# Patient Record
Sex: Female | Born: 1995 | Race: White | Hispanic: No | Marital: Married | Smoking: Never smoker
Health system: Southern US, Community
[De-identification: ages and names within clinical notes are randomized; demographics above are authoritative.]

## PROBLEM LIST (undated history)

## (undated) ENCOUNTER — Inpatient Hospital Stay (HOSPITAL_COMMUNITY): Payer: Self-pay

## (undated) DIAGNOSIS — K831 Obstruction of bile duct: Secondary | ICD-10-CM

## (undated) DIAGNOSIS — Z789 Other specified health status: Secondary | ICD-10-CM

## (undated) DIAGNOSIS — O26649 Intrahepatic cholestasis of pregnancy, unspecified trimester: Secondary | ICD-10-CM

## (undated) DIAGNOSIS — O26619 Liver and biliary tract disorders in pregnancy, unspecified trimester: Secondary | ICD-10-CM

## (undated) DIAGNOSIS — O24419 Gestational diabetes mellitus in pregnancy, unspecified control: Secondary | ICD-10-CM

## (undated) DIAGNOSIS — M419 Scoliosis, unspecified: Secondary | ICD-10-CM

## (undated) DIAGNOSIS — K589 Irritable bowel syndrome without diarrhea: Secondary | ICD-10-CM

## (undated) HISTORY — DX: Other specified health status: Z78.9

## (undated) HISTORY — DX: Gestational diabetes mellitus in pregnancy, unspecified control: O24.419

## (undated) HISTORY — PX: TONSILLECTOMY: SUR1361

## (undated) HISTORY — DX: Intrahepatic cholestasis of pregnancy, unspecified trimester: O26.649

## (undated) HISTORY — DX: Irritable bowel syndrome, unspecified: K58.9

## (undated) HISTORY — PX: WISDOM TOOTH EXTRACTION: SHX21

## (undated) HISTORY — DX: Obstruction of bile duct: K83.1

## (undated) HISTORY — DX: Liver and biliary tract disorders in pregnancy, unspecified trimester: O26.619

---

## 2015-05-11 NOTE — L&D Delivery Note (Signed)
  Roxanne MinsHelms, Boy Burdette [[id number]]  Delivery Note At 3:49 PM a viable female was delivered via  (Presentation: ROA, compound).  APGAR: 8, 9; weight pending . Placenta status: delivered intact with gentle traction.  Cord: 3 vessel with the following complications: nuchal x1 .  Cord pH: not colected  Mom to postpartum.  Baby to Couplet care / Skin to Skin.  Ernestina Pennaicholas Dez Stauffer 02/18/2016, 4:56 PM     Karle BarrHelms, BoyB Izetta [[id number]]  Delivery Note At 3:55 PM a viable female was delivered via  (Presentation: footling breech ).  APGAR: 1, 9; weight pending .   Placenta status: delivered intact .  Cord: 3 vessels with the following complications: none.  Cord pH: none  Mom to postpartum.  Baby to Couplet care / Skin to Skin.   Anesthesia:  epidural Episiotomy: None Lacerations: Labial Suture Repair: 3.0 vicryl Est. Blood Loss (mL):  200   Ernestina Pennaicholas Fatimah Sundquist 02/18/2016, 4:56 PM

## 2015-07-09 LAB — CYTOLOGY - PAP: PAP SMEAR: NEGATIVE

## 2015-12-19 LAB — GLUCOSE TOLERANCE, 1 HOUR: Glucose 1 Hour: 161

## 2015-12-22 LAB — OB RESULTS CONSOLE ABO/RH: RH TYPE: POSITIVE

## 2015-12-22 LAB — OB RESULTS CONSOLE RPR: RPR: NONREACTIVE

## 2015-12-22 LAB — OB RESULTS CONSOLE RUBELLA ANTIBODY, IGM: Rubella: NON-IMMUNE/NOT IMMUNE

## 2015-12-22 LAB — OB RESULTS CONSOLE HEPATITIS B SURFACE ANTIGEN: Hepatitis B Surface Ag: NEGATIVE

## 2015-12-22 LAB — OB RESULTS CONSOLE GC/CHLAMYDIA
Chlamydia: NEGATIVE
GC PROBE AMP, GENITAL: NEGATIVE

## 2015-12-22 LAB — OB RESULTS CONSOLE ANTIBODY SCREEN: ANTIBODY SCREEN: NEGATIVE

## 2015-12-22 LAB — OB RESULTS CONSOLE HIV ANTIBODY (ROUTINE TESTING): HIV: NONREACTIVE

## 2015-12-30 LAB — OB RESULTS CONSOLE PLATELET COUNT: Platelets: 243 10*3/uL

## 2015-12-30 LAB — OB RESULTS CONSOLE HGB/HCT, BLOOD
HCT: 30 %
HEMOGLOBIN: 9.7 g/dL

## 2016-01-05 ENCOUNTER — Ambulatory Visit: Payer: Medicaid Other | Admitting: *Deleted

## 2016-01-05 ENCOUNTER — Encounter: Payer: Medicaid Other | Attending: Obstetrics and Gynecology | Admitting: Dietician

## 2016-01-05 DIAGNOSIS — O24419 Gestational diabetes mellitus in pregnancy, unspecified control: Secondary | ICD-10-CM | POA: Insufficient documentation

## 2016-01-05 DIAGNOSIS — Z713 Dietary counseling and surveillance: Secondary | ICD-10-CM | POA: Insufficient documentation

## 2016-01-05 NOTE — Progress Notes (Signed)
Diabetes Education: 01/05/16 Katherine Munoz is a 20 yr old female patient coming to our clinic from [place].  She is a G0P1  EDD: 03/09/2016.  Pregnant with twins.  Has a meter and has been checking fasting and 1 hr pp glucose levels.  Was told her 1 hr pp goal was 120 mg/dl.  Notes she can only get that level reading if she walks between the time she eats and the time she test.  Brief review of the post delivery self-care measures to assist in preventing developing type 2 DM in the future. Verbalizes a desire to breast feed the twins.  Notes she has some family support with breast feeding. Review of the factors affecting blood glucose. Recommended waling 30 minutes or more daily. Review of blood glucose monitoring.  Ask she aim for our clinic goals of Fasting: 60-90 mg/dl and 2 hr pp (after first bite): 120 mg/dl or less. Currently fasting levels are reported 70's to 84, 86 mg/dl. While the 1 hr levels have been 125, 150, 115. Depended on the amount of walking she did. Brief review of carb counting and diet questions. Provided Handout "NUtrition, Diabetes, and Pregnancy along with the yellow carb counting card. Will follow as needed. Maggie Henery Betzold, RN, RD, LDN

## 2016-01-06 DIAGNOSIS — O0990 Supervision of high risk pregnancy, unspecified, unspecified trimester: Secondary | ICD-10-CM

## 2016-01-08 DIAGNOSIS — O0990 Supervision of high risk pregnancy, unspecified, unspecified trimester: Secondary | ICD-10-CM | POA: Insufficient documentation

## 2016-01-19 ENCOUNTER — Encounter: Payer: Self-pay | Admitting: Obstetrics and Gynecology

## 2016-01-19 ENCOUNTER — Ambulatory Visit (INDEPENDENT_AMBULATORY_CARE_PROVIDER_SITE_OTHER): Payer: Medicaid Other | Admitting: Obstetrics and Gynecology

## 2016-01-19 ENCOUNTER — Encounter: Payer: Medicaid Other | Attending: Obstetrics and Gynecology | Admitting: Dietician

## 2016-01-19 VITALS — BP 113/75 | HR 106 | Wt 152.9 lb

## 2016-01-19 DIAGNOSIS — O24419 Gestational diabetes mellitus in pregnancy, unspecified control: Secondary | ICD-10-CM | POA: Insufficient documentation

## 2016-01-19 DIAGNOSIS — O2343 Unspecified infection of urinary tract in pregnancy, third trimester: Secondary | ICD-10-CM

## 2016-01-19 DIAGNOSIS — B951 Streptococcus, group B, as the cause of diseases classified elsewhere: Secondary | ICD-10-CM | POA: Insufficient documentation

## 2016-01-19 DIAGNOSIS — Z713 Dietary counseling and surveillance: Secondary | ICD-10-CM | POA: Insufficient documentation

## 2016-01-19 DIAGNOSIS — O0993 Supervision of high risk pregnancy, unspecified, third trimester: Secondary | ICD-10-CM

## 2016-01-19 DIAGNOSIS — O30049 Twin pregnancy, dichorionic/diamniotic, unspecified trimester: Secondary | ICD-10-CM | POA: Insufficient documentation

## 2016-01-19 DIAGNOSIS — Z23 Encounter for immunization: Secondary | ICD-10-CM

## 2016-01-19 DIAGNOSIS — O2340 Unspecified infection of urinary tract in pregnancy, unspecified trimester: Secondary | ICD-10-CM

## 2016-01-19 LAB — POCT URINALYSIS DIP (DEVICE)
BILIRUBIN URINE: NEGATIVE
GLUCOSE, UA: NEGATIVE mg/dL
HGB URINE DIPSTICK: NEGATIVE
Ketones, ur: NEGATIVE mg/dL
NITRITE: NEGATIVE
Protein, ur: NEGATIVE mg/dL
Specific Gravity, Urine: 1.02 (ref 1.005–1.030)
UROBILINOGEN UA: 0.2 mg/dL (ref 0.0–1.0)
pH: 6.5 (ref 5.0–8.0)

## 2016-01-19 MED ORDER — TETANUS-DIPHTH-ACELL PERTUSSIS 5-2.5-18.5 LF-MCG/0.5 IM SUSP
0.5000 mL | Freq: Once | INTRAMUSCULAR | Status: AC
  Administered 2016-01-19: 0.5 mL via INTRAMUSCULAR

## 2016-01-19 NOTE — Progress Notes (Signed)
New OB Note  01/19/2016   Clinic: [facility]  Chief Complaint: Transfer of care  Transfer of Care Patient: Yes, [name]  History of Present Illness: Ms. Katherine Munoz is a 20 y.o. G2P0010 @ 32/6 weeks (EDC 10/31, based on 9wk u/s); LMP 05/28/2015 Preg complicated by has Supervision of high-risk pregnancy; GDM (gestational diabetes mellitus); Dichorionic diamniotic twin gestation; and GBS (group B streptococcus) UTI complicating pregnancy on her problem list. just feeling some pressure but no s/s of labor or decreased FM.   She was using oral contraceptives (estrogen/progesterone) when she conceived.  She has Negative signs or symptoms of miscarriage or preterm labor  ROS: A 12-point review of systems was performed and negative, except as stated in the above HPI.  OBGYN History: As per HPI. OB History  Gravida Para Term Preterm AB Living  2 0 0 0 1 0  SAB TAB Ectopic Multiple Live Births  1            # Outcome Date GA Lbr Len/2nd Weight Sex Delivery Anes PTL Lv  2 Current           1 SAB 2016              Any issues with any prior pregnancies: not applicable Any prior children are healthy, doing well, without any problems or issues: not applicable History of pap smears: Yes. Last pap smear 2017. (negative) History of STIs: No   Past Medical History: Past Medical History:  Diagnosis Date  . Medical history non-contributory     Past Surgical History: Past Surgical History:  Procedure Laterality Date  . TONSILLECTOMY      Family History:  Family History  Problem Relation Age of Onset  . Heart disease Maternal Grandfather    She denies any female cancers, bleeding or blood clotting disorders.  She denies any history of mental retardation, birth defects or genetic disorders in her or the FOB's history  Social History:  Social History   Social History  . Marital status: Single    Spouse name: N/A  . Number of children: N/A  . Years of education: N/A   Occupational  History  . Not on file.   Social History Main Topics  . Smoking status: Never Smoker  . Smokeless tobacco: Never Used  . Alcohol use No  . Drug use: No  . Sexual activity: Not Currently    Birth control/ protection: Pill   Other Topics Concern  . Not on file   Social History Narrative  . No narrative on file   Not currently working In a relationship with FOB  Allergy: No Known Allergies  Health Maintenance:  Mammogram Up to Date: not applicable  Current Outpatient Medications: PNV  Physical Exam:   BP 113/75   Pulse (!) 106   Wt 152 lb 14.4 oz (69.4 kg)   LMP 05/28/2015   BMI 28.89 kg/m  Body mass index is 28.89 kg/m. FHTs: normal x 2  General appearance: Well nourished, well developed female in no acute distress.   Laboratory: O pos/RI/syphilis screen negative/hiv neg/hepB neg/pap neg 2017/GC-CT neg UCx +GBS early in pregnancy  BS log review: normal qid checks with borderline BS 2hr PP dinner values (approx 25% in the 120s-130s)  Imaging:  Normal anatomy scan  Assessment: pt doing well  Plan: 1. Needs flu shot - Flu Vaccine QUAD 36+ mos IM (Fluarix, Quad PF)  2. Need for Tdap vaccination - Tdap (BOOSTRIX) injection 0.5 mL; Inject 0.5 mLs into  the muscle once.  3. Supervision of high-risk pregnancy, third trimester Scheduled for growth u/s next week.  - US MFM OB COMP + 14 WK; Future - US MFM OB COMP ADDL GEST + 14 WK; Future  4. GBS (group B streptococcus) UTI complicating pregnancy, third trimester TOC today  5. GDMa1 Continue with a1 control. Will have her see Seward GraterMaggie in DM education today and f/u on 2hr dinner values next week  6. Di-Di D/w pt re: plan of care delivery mode. She is interested in vaginal delivery and would be candidate if vtx fetal A and size concordant. Delivery by 38wks Pt told to add 1mg  FA  Problem list reviewed and updated.  Follow up in 1 weeks.  >50% of 25 min visit spent on counseling and coordination of care.      Cornelia Copaharlie Leo Fray, Jr. MD Attending Center for [facility](Faculty Practice)

## 2016-01-19 NOTE — Progress Notes (Signed)
Diabetes Education: 01/19/16 Luther ParodyCaitlin is seen today for f/u regarding blood glucose levels.  Currently at 5060w6d.  Previously received review of GDM self-care management with twins. Fasting levels: 80, 82, 83, 75, 69 , 77, 88. 2 hr post BK: 114, 107, 75, 122, 96, 94. 2 hr post Lunch: 117, 96, 107, 107, 103, 101. 2 hr post Dinner: 130, 123, 124, 107, 130, 136. Notes that the evening meal has been the greatest problem.  Her significant other and is family are [place] and have a diet high in rice and plantains.  She is working to monitor portion sizes. Has found walking to be more difficult.  Becomes short of breath after walking greater distances.  Advised to try  for 10 minutes in the afternoon before dinner and if possible try to walk for 5-10 minutes after dinner when food has had time to settle. C/o hunger in the afternoon.  Advised to increase carb to 45-60 gm at lunch and at the afternoon snack. Will continue to follow as needed. Maggie May, RN, RD, LDN

## 2016-01-19 NOTE — Progress Notes (Signed)
Flu/tdap today

## 2016-01-21 ENCOUNTER — Encounter (HOSPITAL_COMMUNITY): Payer: Self-pay | Admitting: Obstetrics and Gynecology

## 2016-01-28 ENCOUNTER — Other Ambulatory Visit: Payer: Self-pay | Admitting: Obstetrics and Gynecology

## 2016-01-28 ENCOUNTER — Ambulatory Visit (HOSPITAL_COMMUNITY)
Admission: RE | Admit: 2016-01-28 | Discharge: 2016-01-28 | Disposition: A | Discharge status: Home / Self Care | Payer: Medicaid Other | Source: Ambulatory Visit | Attending: Obstetrics and Gynecology | Admitting: Obstetrics and Gynecology

## 2016-01-28 ENCOUNTER — Ambulatory Visit (INDEPENDENT_AMBULATORY_CARE_PROVIDER_SITE_OTHER): Payer: Medicaid Other | Admitting: Family Medicine

## 2016-01-28 ENCOUNTER — Encounter (HOSPITAL_COMMUNITY): Payer: Self-pay

## 2016-01-28 VITALS — BP 107/76 | HR 107 | Wt 156.2 lb

## 2016-01-28 DIAGNOSIS — Z3A34 34 weeks gestation of pregnancy: Secondary | ICD-10-CM | POA: Insufficient documentation

## 2016-01-28 DIAGNOSIS — O0990 Supervision of high risk pregnancy, unspecified, unspecified trimester: Secondary | ICD-10-CM

## 2016-01-28 DIAGNOSIS — O24410 Gestational diabetes mellitus in pregnancy, diet controlled: Secondary | ICD-10-CM

## 2016-01-28 DIAGNOSIS — O0993 Supervision of high risk pregnancy, unspecified, third trimester: Secondary | ICD-10-CM

## 2016-01-28 DIAGNOSIS — O30043 Twin pregnancy, dichorionic/diamniotic, third trimester: Secondary | ICD-10-CM | POA: Diagnosis not present

## 2016-01-28 LAB — POCT URINALYSIS DIP (DEVICE)
Bilirubin Urine: NEGATIVE
Glucose, UA: NEGATIVE mg/dL
Ketones, ur: NEGATIVE mg/dL
NITRITE: NEGATIVE
PH: 6.5 (ref 5.0–8.0)
PROTEIN: NEGATIVE mg/dL
SPECIFIC GRAVITY, URINE: 1.02 (ref 1.005–1.030)
UROBILINOGEN UA: 0.2 mg/dL (ref 0.0–1.0)

## 2016-01-28 NOTE — Patient Instructions (Signed)
Third Trimester of Pregnancy The third trimester is from week 29 through week 42, months 7 through 9. The third trimester is a time when the fetus is growing rapidly. At the end of the ninth month, the fetus is about 20 inches in length and weighs 6-10 pounds.  BODY CHANGES Your body goes through many changes during pregnancy. The changes vary from woman to woman.   Your weight will continue to increase. You can expect to gain 25-35 pounds (11-16 kg) by the end of the pregnancy.  You may begin to get stretch marks on your hips, abdomen, and breasts.  You may urinate more often because the fetus is moving lower into your pelvis and pressing on your bladder.  You may develop or continue to have heartburn as a result of your pregnancy.  You may develop constipation because certain hormones are causing the muscles that push waste through your intestines to slow down.  You may develop hemorrhoids or swollen, bulging veins (varicose veins).  You may have pelvic pain because of the weight gain and pregnancy hormones relaxing your joints between the bones in your pelvis. Backaches may result from overexertion of the muscles supporting your posture.  You may have changes in your hair. These can include thickening of your hair, rapid growth, and changes in texture. Some women also have hair loss during or after pregnancy, or hair that feels dry or thin. Your hair will most likely return to normal after your baby is born.  Your breasts will continue to grow and be tender. A yellow discharge may leak from your breasts called colostrum.  Your belly button may stick out.  You may feel short of breath because of your expanding uterus.  You may notice the fetus "dropping," or moving lower in your abdomen.  You may have a bloody mucus discharge. This usually occurs a few days to a week before labor begins.  Your cervix becomes thin and soft (effaced) near your due date. WHAT TO EXPECT AT YOUR  PRENATAL EXAMS  You will have prenatal exams every 2 weeks until week 36. Then, you will have weekly prenatal exams. During a routine prenatal visit:  You will be weighed to make sure you and the fetus are growing normally.  Your blood pressure is taken.  Your abdomen will be measured to track your baby's growth.  The fetal heartbeat will be listened to.  Any test results from the previous visit will be discussed.  You may have a cervical check near your due date to see if you have effaced. At around 36 weeks, your caregiver will check your cervix. At the same time, your caregiver will also perform a test on the secretions of the vaginal tissue. This test is to determine if a type of bacteria, Group B streptococcus, is present. Your caregiver will explain this further. Your caregiver may ask you:  What your birth plan is.  How you are feeling.  If you are feeling the baby move.  If you have had any abnormal symptoms, such as leaking fluid, bleeding, severe headaches, or abdominal cramping.  If you are using any tobacco products, including cigarettes, chewing tobacco, and electronic cigarettes.  If you have any questions. Other tests or screenings that may be performed during your third trimester include:  Blood tests that check for low iron levels (anemia).  Fetal testing to check the health, activity level, and growth of the fetus. Testing is done if you have certain medical conditions or if   there are problems during the pregnancy.  HIV (human immunodeficiency virus) testing. If you are at high risk, you may be screened for HIV during your third trimester of pregnancy. FALSE LABOR You may feel small, irregular contractions that eventually go away. These are called Braxton Hicks contractions, or false labor. Contractions may last for hours, days, or even weeks before true labor sets in. If contractions come at regular intervals, intensify, or become painful, it is best to be seen  by your caregiver.  SIGNS OF LABOR   Menstrual-like cramps.  Contractions that are 5 minutes apart or less.  Contractions that start on the top of the uterus and spread down to the lower abdomen and back.  A sense of increased pelvic pressure or back pain.  A watery or bloody mucus discharge that comes from the vagina. If you have any of these signs before the 37th week of pregnancy, call your caregiver right away. You need to go to the hospital to get checked immediately. HOME CARE INSTRUCTIONS   Avoid all smoking, herbs, alcohol, and unprescribed drugs. These chemicals affect the formation and growth of the baby.  Do not use any tobacco products, including cigarettes, chewing tobacco, and electronic cigarettes. If you need help quitting, ask your health care provider. You may receive counseling support and other resources to help you quit.  Follow your caregiver's instructions regarding medicine use. There are medicines that are either safe or unsafe to take during pregnancy.  Exercise only as directed by your caregiver. Experiencing uterine cramps is a good sign to stop exercising.  Continue to eat regular, healthy meals.  Wear a good support bra for breast tenderness.  Do not use hot tubs, steam rooms, or saunas.  Wear your seat belt at all times when driving.  Avoid raw meat, uncooked cheese, cat litter boxes, and soil used by cats. These carry germs that can cause birth defects in the baby.  Take your prenatal vitamins.  Take 1500-2000 mg of calcium daily starting at the 20th week of pregnancy until you deliver your baby.  Try taking a stool softener (if your caregiver approves) if you develop constipation. Eat more high-fiber foods, such as fresh vegetables or fruit and whole grains. Drink plenty of fluids to keep your urine clear or pale yellow.  Take warm sitz baths to soothe any pain or discomfort caused by hemorrhoids. Use hemorrhoid cream if your caregiver  approves.  If you develop varicose veins, wear support hose. Elevate your feet for 15 minutes, 3-4 times a day. Limit salt in your diet.  Avoid heavy lifting, wear low heal shoes, and practice good posture.  Rest a lot with your legs elevated if you have leg cramps or low back pain.  Visit your dentist if you have not gone during your pregnancy. Use a soft toothbrush to brush your teeth and be gentle when you floss.  A sexual relationship may be continued unless your caregiver directs you otherwise.  Do not travel far distances unless it is absolutely necessary and only with the approval of your caregiver.  Take prenatal classes to understand, practice, and ask questions about the labor and delivery.  Make a trial run to the hospital.  Pack your hospital bag.  Prepare the baby's nursery.  Continue to go to all your prenatal visits as directed by your caregiver. SEEK MEDICAL CARE IF:  You are unsure if you are in labor or if your water has broken.  You have dizziness.  You have   mild pelvic cramps, pelvic pressure, or nagging pain in your abdominal area.  You have persistent nausea, vomiting, or diarrhea.  You have a bad smelling vaginal discharge.  You have pain with urination. SEEK IMMEDIATE MEDICAL CARE IF:   You have a fever.  You are leaking fluid from your vagina.  You have spotting or bleeding from your vagina.  You have severe abdominal cramping or pain.  You have rapid weight loss or gain.  You have shortness of breath with chest pain.  You notice sudden or extreme swelling of your face, hands, ankles, feet, or legs.  You have not felt your baby move in over an hour.  You have severe headaches that do not go away with medicine.  You have vision changes.   This information is not intended to replace advice given to you by your health care provider. Make sure you discuss any questions you have with your health care provider.   Document Released:  04/20/2001 Document Revised: 05/17/2014 Document Reviewed: 06/27/2012 Elsevier Interactive Patient Education 2016 [employer]  Breastfeeding Deciding to breastfeed is one of the best choices you can make for you and your baby. A change in hormones during pregnancy causes your breast tissue to grow and increases the number and size of your milk ducts. These hormones also allow proteins, sugars, and fats from your blood supply to make breast milk in your milk-producing glands. Hormones prevent breast milk from being released before your baby is born as well as prompt milk flow after birth. Once breastfeeding has begun, thoughts of your baby, as well as his or her sucking or crying, can stimulate the release of milk from your milk-producing glands.  BENEFITS OF BREASTFEEDING For Your Baby  Your first milk (colostrum) helps your baby's digestive system function better.  There are antibodies in your milk that help your baby fight off infections.  Your baby has a lower incidence of asthma, allergies, and sudden infant death syndrome.  The nutrients in breast milk are better for your baby than infant formulas and are designed uniquely for your baby's needs.  Breast milk improves your baby's brain development.  Your baby is less likely to develop other conditions, such as childhood obesity, asthma, or type 2 diabetes mellitus. For You  Breastfeeding helps to create a very special bond between you and your baby.  Breastfeeding is convenient. Breast milk is always available at the correct temperature and costs nothing.  Breastfeeding helps to burn calories and helps you lose the weight gained during pregnancy.  Breastfeeding makes your uterus contract to its prepregnancy size faster and slows bleeding (lochia) after you give birth.   Breastfeeding helps to lower your risk of developing type 2 diabetes mellitus, osteoporosis, and breast or ovarian cancer later in life. SIGNS THAT YOUR BABY IS  HUNGRY Early Signs of Hunger  Increased alertness or activity.  Stretching.  Movement of the head from side to side.  Movement of the head and opening of the mouth when the corner of the mouth or cheek is stroked (rooting).  Increased sucking sounds, smacking lips, cooing, sighing, or squeaking.  Hand-to-mouth movements.  Increased sucking of fingers or hands. Late Signs of Hunger  Fussing.  Intermittent crying. Extreme Signs of Hunger Signs of extreme hunger will require calming and consoling before your baby will be able to breastfeed successfully. Do not wait for the following signs of extreme hunger to occur before you initiate breastfeeding:  Restlessness.  A loud, strong cry.  Screaming.   BREASTFEEDING BASICS Breastfeeding Initiation  Find a comfortable place to sit or lie down, with your neck and back well supported.  Place a pillow or rolled up blanket under your baby to bring him or her to the level of your breast (if you are seated). Nursing pillows are specially designed to help support your arms and your baby while you breastfeed.  Make sure that your baby's abdomen is facing your abdomen.  Gently massage your breast. With your fingertips, massage from your chest wall toward your nipple in a circular motion. This encourages milk flow. You may need to continue this action during the feeding if your milk flows slowly.  Support your breast with 4 fingers underneath and your thumb above your nipple. Make sure your fingers are well away from your nipple and your baby's mouth.  Stroke your baby's lips gently with your finger or nipple.  When your baby's mouth is open wide enough, quickly bring your baby to your breast, placing your entire nipple and as much of the colored area around your nipple (areola) as possible into your baby's mouth.  More areola should be visible above your baby's upper lip than below the lower lip.  Your baby's tongue should be between his  or her lower gum and your breast.  Ensure that your baby's mouth is correctly positioned around your nipple (latched). Your baby's lips should create a seal on your breast and be turned out (everted).  It is common for your baby to suck about 2-3 minutes in order to start the flow of breast milk. Latching Teaching your baby how to latch on to your breast properly is very important. An improper latch can cause nipple pain and decreased milk supply for you and poor weight gain in your baby. Also, if your baby is not latched onto your nipple properly, he or she may swallow some air during feeding. This can make your baby fussy. Burping your baby when you switch breasts during the feeding can help to get rid of the air. However, teaching your baby to latch on properly is still the best way to prevent fussiness from swallowing air while breastfeeding. Signs that your baby has successfully latched on to your nipple:  Silent tugging or silent sucking, without causing you pain.  Swallowing heard between every 3-4 sucks.  Muscle movement above and in front of his or her ears while sucking. Signs that your baby has not successfully latched on to nipple:  Sucking sounds or smacking sounds from your baby while breastfeeding.  Nipple pain. If you think your baby has not latched on correctly, slip your finger into the corner of your baby's mouth to break the suction and place it between your baby's gums. Attempt breastfeeding initiation again. Signs of Successful Breastfeeding Signs from your baby:  A gradual decrease in the number of sucks or complete cessation of sucking.  Falling asleep.  Relaxation of his or her body.  Retention of a small amount of milk in his or her mouth.  Letting go of your breast by himself or herself. Signs from you:  Breasts that have increased in firmness, weight, and size 1-3 hours after feeding.  Breasts that are softer immediately after  breastfeeding.  Increased milk volume, as well as a change in milk consistency and color by the fifth day of breastfeeding.  Nipples that are not sore, cracked, or bleeding. Signs That Your Baby is Getting Enough Milk  Wetting at least 3 diapers in a 24-hour period.   The urine should be clear and pale yellow by age 5 days.  At least 3 stools in a 24-hour period by age 5 days. The stool should be soft and yellow.  At least 3 stools in a 24-hour period by age 7 days. The stool should be seedy and yellow.  No loss of weight greater than 10% of birth weight during the first 3 days of age.  Average weight gain of 4-7 ounces (113-198 g) per week after age 4 days.  Consistent daily weight gain by age 5 days, without weight loss after the age of 2 weeks. After a feeding, your baby may spit up a small amount. This is common. BREASTFEEDING FREQUENCY AND DURATION Frequent feeding will help you make more milk and can prevent sore nipples and breast engorgement. Breastfeed when you feel the need to reduce the fullness of your breasts or when your baby shows signs of hunger. This is called "breastfeeding on demand." Avoid introducing a pacifier to your baby while you are working to establish breastfeeding (the first 4-6 weeks after your baby is born). After this time you may choose to use a pacifier. Research has shown that pacifier use during the first year of a baby's life decreases the risk of sudden infant death syndrome (SIDS). Allow your baby to feed on each breast as long as he or she wants. Breastfeed until your baby is finished feeding. When your baby unlatches or falls asleep while feeding from the first breast, offer the second breast. Because newborns are often sleepy in the first few weeks of life, you may need to awaken your baby to get him or her to feed. Breastfeeding times will vary from baby to baby. However, the following rules can serve as a guide to help you ensure that your baby is  properly fed:  Newborns (babies 4 weeks of age or younger) may breastfeed every 1-3 hours.  Newborns should not go longer than 3 hours during the day or 5 hours during the night without breastfeeding.  You should breastfeed your baby a minimum of 8 times in a 24-hour period until you begin to introduce solid foods to your baby at around 6 months of age. BREAST MILK PUMPING Pumping and storing breast milk allows you to ensure that your baby is exclusively fed your breast milk, even at times when you are unable to breastfeed. This is especially important if you are going back to work while you are still breastfeeding or when you are not able to be present during feedings. Your lactation consultant can give you guidelines on how long it is safe to store breast milk. A breast pump is a machine that allows you to pump milk from your breast into a sterile bottle. The pumped breast milk can then be stored in a refrigerator or freezer. Some breast pumps are operated by hand, while others use electricity. Ask your lactation consultant which type will work best for you. Breast pumps can be purchased, but some hospitals and breastfeeding support groups lease breast pumps on a monthly basis. A lactation consultant can teach you how to hand express breast milk, if you prefer not to use a pump. CARING FOR YOUR BREASTS WHILE YOU BREASTFEED Nipples can become dry, cracked, and sore while breastfeeding. The following recommendations can help keep your breasts moisturized and healthy:  Avoid using soap on your nipples.  Wear a supportive bra. Although not required, special nursing bras and tank tops are designed to allow access to your   breasts for breastfeeding without taking off your entire bra or top. Avoid wearing underwire-style bras or extremely tight bras.  Air dry your nipples for 3-4minutes after each feeding.  Use only cotton bra pads to absorb leaked breast milk. Leaking of breast milk between feedings  is normal.  Use lanolin on your nipples after breastfeeding. Lanolin helps to maintain your skin's normal moisture barrier. If you use pure lanolin, you do not need to wash it off before feeding your baby again. Pure lanolin is not toxic to your baby. You may also hand express a few drops of breast milk and gently massage that milk into your nipples and allow the milk to air dry. In the first few weeks after giving birth, some women experience extremely full breasts (engorgement). Engorgement can make your breasts feel heavy, warm, and tender to the touch. Engorgement peaks within 3-5 days after you give birth. The following recommendations can help ease engorgement:  Completely empty your breasts while breastfeeding or pumping. You may want to start by applying warm, moist heat (in the shower or with warm water-soaked hand towels) just before feeding or pumping. This increases circulation and helps the milk flow. If your baby does not completely empty your breasts while breastfeeding, pump any extra milk after he or she is finished.  Wear a snug bra (nursing or regular) or tank top for 1-2 days to signal your body to slightly decrease milk production.  Apply ice packs to your breasts, unless this is too uncomfortable for you.  Make sure that your baby is latched on and positioned properly while breastfeeding. If engorgement persists after 48 hours of following these recommendations, contact your health care provider or a lactation consultant. OVERALL HEALTH CARE RECOMMENDATIONS WHILE BREASTFEEDING  Eat healthy foods. Alternate between meals and snacks, eating 3 of each per day. Because what you eat affects your breast milk, some of the foods may make your baby more irritable than usual. Avoid eating these foods if you are sure that they are negatively affecting your baby.  Drink milk, fruit juice, and water to satisfy your thirst (about 10 glasses a day).  Rest often, relax, and continue to take  your prenatal vitamins to prevent fatigue, stress, and anemia.  Continue breast self-awareness checks.  Avoid chewing and smoking tobacco. Chemicals from cigarettes that pass into breast milk and exposure to secondhand smoke may harm your baby.  Avoid alcohol and drug use, including marijuana. Some medicines that may be harmful to your baby can pass through breast milk. It is important to ask your health care provider before taking any medicine, including all over-the-counter and prescription medicine as well as vitamin and herbal supplements. It is possible to become pregnant while breastfeeding. If birth control is desired, ask your health care provider about options that will be safe for your baby. SEEK MEDICAL CARE IF:  You feel like you want to stop breastfeeding or have become frustrated with breastfeeding.  You have painful breasts or nipples.  Your nipples are cracked or bleeding.  Your breasts are red, tender, or warm.  You have a swollen area on either breast.  You have a fever or chills.  You have nausea or vomiting.  You have drainage other than breast milk from your nipples.  Your breasts do not become full before feedings by the fifth day after you give birth.  You feel sad and depressed.  Your baby is too sleepy to eat well.  Your baby is having trouble sleeping.     Your baby is wetting less than 3 diapers in a 24-hour period.  Your baby has less than 3 stools in a 24-hour period.  Your baby's skin or the white part of his or her eyes becomes yellow.   Your baby is not gaining weight by 5 days of age. SEEK IMMEDIATE MEDICAL CARE IF:  Your baby is overly tired (lethargic) and does not want to wake up and feed.  Your baby develops an unexplained fever.   This information is not intended to replace advice given to you by your health care provider. Make sure you discuss any questions you have with your health care provider.   Document Released: 04/26/2005  Document Revised: 01/15/2015 Document Reviewed: 10/18/2012 Elsevier Interactive Patient Education 2016 [employer]  

## 2016-01-28 NOTE — Progress Notes (Signed)
   PRENATAL VISIT NOTE  Subjective:  Katherine Munoz is a 20 y.o. G2P0010 at 6266w1d being seen today for ongoing prenatal care.  She is currently monitored for the following issues for this high-risk pregnancy and has Supervision of high-risk pregnancy; GDM (gestational diabetes mellitus); Dichorionic diamniotic twin gestation; and GBS (group B streptococcus) UTI complicating pregnancy on her problem list.  Patient reports no complaints.  Contractions: Not present. Vag. Bleeding: None.  Movement: Present. Denies leaking of fluid.   The following portions of the patient's history were reviewed and updated as appropriate: allergies, current medications, past family history, past medical history, past social history, past surgical history and problem list. Problem list updated.  Objective:   Vitals:   01/28/16 1544  BP: 107/76  Pulse: (!) 107  Weight: 156 lb 3.2 oz (70.9 kg)    Fetal Status: Fetal Heart Rate (bpm): 144/152   Movement: Present   Fundal Height 41 cm  General:  Alert, oriented and cooperative. Patient is in no acute distress.  Skin: Skin is warm and dry. No rash noted.   Cardiovascular: Normal heart rate noted  Respiratory: Normal respiratory effort, no problems with respiration noted  Abdomen: Soft, gravid, appropriate for gestational age. Pain/Pressure: Present     Pelvic:  Cervical exam deferred        Extremities: Normal range of motion.  Edema: None  Mental Status: Normal mood and affect. Normal behavior. Normal judgment and thought content.   FBS 69-82 2 hour 82-151 3 outside range U/s today A vertex 5 lb 11 oz (77%) BPP8/8, B trv 4 lb 14 oz (44%) BPP8/8, Discordance 16% Assessment and Plan:  Pregnancy: G2P0010 at 7966w1d  1. Supervision of high-risk pregnancy, unspecified trimester   2. Diet controlled gestational diabetes mellitus in third trimester Continue diet and exercise, goals of glycemic control reviewed  3. Dichorionic diamniotic twin pregnancy in third  trimester Begin 2x/wk testing next week at 35 wks for DC/DA twins Delivery by 38 wks.  Preterm labor symptoms and general obstetric precautions including but not limited to vaginal bleeding, contractions, leaking of fluid and fetal movement were reviewed in detail with the patient. Please refer to After Visit Summary for other counseling recommendations.  Return in 1 week (on 02/04/2016) for OB visit and NST x 2.  Reva Boresanya S Hellen Shanley, MD

## 2016-01-29 ENCOUNTER — Encounter: Payer: Self-pay | Admitting: Obstetrics and Gynecology

## 2016-01-29 ENCOUNTER — Other Ambulatory Visit (HOSPITAL_COMMUNITY): Payer: Self-pay | Admitting: *Deleted

## 2016-01-29 ENCOUNTER — Encounter: Payer: Medicaid Other | Admitting: Obstetrics & Gynecology

## 2016-01-29 DIAGNOSIS — O359XX2 Maternal care for (suspected) fetal abnormality and damage, unspecified, fetus 2: Secondary | ICD-10-CM

## 2016-01-29 DIAGNOSIS — O283 Abnormal ultrasonic finding on antenatal screening of mother: Secondary | ICD-10-CM | POA: Insufficient documentation

## 2016-02-02 ENCOUNTER — Other Ambulatory Visit: Payer: Medicaid Other | Admitting: Advanced Practice Midwife

## 2016-02-04 ENCOUNTER — Telehealth: Payer: Self-pay | Admitting: *Deleted

## 2016-02-04 ENCOUNTER — Ambulatory Visit (HOSPITAL_COMMUNITY)
Admission: RE | Admit: 2016-02-04 | Discharge: 2016-02-04 | Disposition: A | Discharge status: Home / Self Care | Payer: Medicaid Other | Source: Ambulatory Visit | Attending: Obstetrics and Gynecology | Admitting: Obstetrics and Gynecology

## 2016-02-04 ENCOUNTER — Encounter (HOSPITAL_COMMUNITY): Payer: Self-pay

## 2016-02-04 DIAGNOSIS — O24410 Gestational diabetes mellitus in pregnancy, diet controlled: Secondary | ICD-10-CM | POA: Diagnosis not present

## 2016-02-04 DIAGNOSIS — Z3A35 35 weeks gestation of pregnancy: Secondary | ICD-10-CM | POA: Insufficient documentation

## 2016-02-04 DIAGNOSIS — O358XX0 Maternal care for other (suspected) fetal abnormality and damage, not applicable or unspecified: Secondary | ICD-10-CM | POA: Insufficient documentation

## 2016-02-04 DIAGNOSIS — O359XX2 Maternal care for (suspected) fetal abnormality and damage, unspecified, fetus 2: Secondary | ICD-10-CM

## 2016-02-04 DIAGNOSIS — O30043 Twin pregnancy, dichorionic/diamniotic, third trimester: Secondary | ICD-10-CM | POA: Diagnosis not present

## 2016-02-04 NOTE — Telephone Encounter (Signed)
Pt left message yesterday stating that she has been exposed to Hand, Foot and Mouth disease from a family member. I called pt back today and discussed this concern. Her boyfriend's nephew has been sick x1 week with cough and was diagnosed yesterday. Katherine Munoz reports that last week she shared ice cream with the boy and yesterday when he was coughing, she covered his mouth with her hand but didn't wash her hand afterward. She is exhibiting no sx currently. I advised pt to discuss with MFM @ her appt today if possible and also to mention this information to Dr. Debroah LoopArnold at her visit in office tomorrow. She should monitor herself for sx such as cough, fever or anything else unusual and report the sx to our office immediately.  Pt voiced understanding.

## 2016-02-05 ENCOUNTER — Other Ambulatory Visit (HOSPITAL_COMMUNITY)
Admission: RE | Admit: 2016-02-05 | Discharge: 2016-02-05 | Disposition: A | Discharge status: Home / Self Care | Payer: Medicaid Other | Source: Ambulatory Visit | Attending: Obstetrics & Gynecology | Admitting: Obstetrics & Gynecology

## 2016-02-05 ENCOUNTER — Ambulatory Visit (INDEPENDENT_AMBULATORY_CARE_PROVIDER_SITE_OTHER): Payer: Medicaid Other | Admitting: Obstetrics & Gynecology

## 2016-02-05 VITALS — BP 114/79 | HR 107 | Wt 158.3 lb

## 2016-02-05 DIAGNOSIS — Z113 Encounter for screening for infections with a predominantly sexual mode of transmission: Secondary | ICD-10-CM | POA: Diagnosis not present

## 2016-02-05 DIAGNOSIS — O30043 Twin pregnancy, dichorionic/diamniotic, third trimester: Secondary | ICD-10-CM

## 2016-02-05 DIAGNOSIS — O0993 Supervision of high risk pregnancy, unspecified, third trimester: Secondary | ICD-10-CM

## 2016-02-05 LAB — POCT URINALYSIS DIP (DEVICE)
BILIRUBIN URINE: NEGATIVE
Glucose, UA: NEGATIVE mg/dL
HGB URINE DIPSTICK: NEGATIVE
KETONES UR: NEGATIVE mg/dL
NITRITE: NEGATIVE
PH: 6.5 (ref 5.0–8.0)
Protein, ur: NEGATIVE mg/dL
Specific Gravity, Urine: 1.015 (ref 1.005–1.030)
Urobilinogen, UA: 0.2 mg/dL (ref 0.0–1.0)

## 2016-02-05 NOTE — Progress Notes (Signed)
   PRENATAL VISIT NOTE  Subjective:  Katherine Munoz is a 20 y.o. G2P0010 at 3316w2d being seen today for ongoing prenatal care.  She is currently monitored for the following issues for this high-risk pregnancy and has Supervision of high-risk pregnancy; GDM (gestational diabetes mellitus); Dichorionic diamniotic twin gestation; GBS (group B streptococcus) UTI complicating pregnancy; and twin b with enlarged. cisterna magna on 9/20 u/s on her problem list.  Patient reports occasional contractions.  Contractions: Irritability. Vag. Bleeding: None.  Movement: Present. Denies leaking of fluid.   The following portions of the patient's history were reviewed and updated as appropriate: allergies, current medications, past family history, past medical history, past social history, past surgical history and problem list. Problem list updated.  Objective:   Vitals:   02/05/16 1311  BP: 114/79  Pulse: (!) 107  Weight: 158 lb 4.8 oz (71.8 kg)    Fetal Status: Fetal Heart Rate (bpm): NST   Movement: Present     General:  Alert, oriented and cooperative. Patient is in no acute distress.  Skin: Skin is warm and dry. No rash noted.   Cardiovascular: Normal heart rate noted  Respiratory: Normal respiratory effort, no problems with respiration noted  Abdomen: Soft, gravid, appropriate for gestational age. Pain/Pressure: Present     Pelvic:  Cervical exam performed        Extremities: Normal range of motion.  Edema: None  Mental Status: Normal mood and affect. Normal behavior. Normal judgment and thought content.   Urinalysis: Urine Protein: Negative Urine Glucose: Negative  Assessment and Plan:  Pregnancy: G2P0010 at 3916w2d  1. Dichorionic diamniotic twin pregnancy in third trimester NST reactive and US and BPP reviewed - Fetal nonstress test  2. Supervision of high-risk pregnancy, third trimester  - GC/Chlamydia probe amp ([place])[facility] at [facility]  Preterm labor symptoms and general obstetric  precautions including but not limited to vaginal bleeding, contractions, leaking of fluid and fetal movement were reviewed in detail with the patient. Please refer to After Visit Summary for other counseling recommendations.  Return in about 6 days (around 02/11/2016) for Ob fu and NST (1440 if possible).  Adam PhenixJames G Orine Goga, MD

## 2016-02-05 NOTE — Progress Notes (Signed)
nNST reactive X2

## 2016-02-05 NOTE — Progress Notes (Signed)
Pt is receiving weekly BPP's @ MFM - continue with weekly NST's as well.

## 2016-02-06 ENCOUNTER — Inpatient Hospital Stay (HOSPITAL_COMMUNITY)
Admission: AD | Admit: 2016-02-06 | Discharge: 2016-02-06 | Disposition: A | Discharge status: Home / Self Care | Payer: Medicaid Other | Source: Ambulatory Visit | Attending: Obstetrics and Gynecology | Admitting: Obstetrics and Gynecology

## 2016-02-06 ENCOUNTER — Encounter (HOSPITAL_COMMUNITY): Payer: Self-pay | Admitting: Certified Nurse Midwife

## 2016-02-06 DIAGNOSIS — O2686 Pruritic urticarial papules and plaques of pregnancy (PUPPP): Secondary | ICD-10-CM | POA: Insufficient documentation

## 2016-02-06 DIAGNOSIS — O24410 Gestational diabetes mellitus in pregnancy, diet controlled: Secondary | ICD-10-CM | POA: Diagnosis not present

## 2016-02-06 DIAGNOSIS — O99820 Streptococcus B carrier state complicating pregnancy: Secondary | ICD-10-CM | POA: Diagnosis not present

## 2016-02-06 DIAGNOSIS — O30043 Twin pregnancy, dichorionic/diamniotic, third trimester: Secondary | ICD-10-CM | POA: Insufficient documentation

## 2016-02-06 DIAGNOSIS — Z3A35 35 weeks gestation of pregnancy: Secondary | ICD-10-CM | POA: Diagnosis not present

## 2016-02-06 DIAGNOSIS — R21 Rash and other nonspecific skin eruption: Secondary | ICD-10-CM | POA: Diagnosis present

## 2016-02-06 HISTORY — DX: Scoliosis, unspecified: M41.9

## 2016-02-06 LAB — COMPREHENSIVE METABOLIC PANEL
ALBUMIN: 3 g/dL — AB (ref 3.5–5.0)
ALT: 9 U/L — ABNORMAL LOW (ref 14–54)
AST: 23 U/L (ref 15–41)
Alkaline Phosphatase: 207 U/L — ABNORMAL HIGH (ref 38–126)
Anion gap: 6 (ref 5–15)
BILIRUBIN TOTAL: 0.3 mg/dL (ref 0.3–1.2)
BUN: 16 mg/dL (ref 6–20)
CO2: 24 mmol/L (ref 22–32)
Calcium: 9.5 mg/dL (ref 8.9–10.3)
Chloride: 105 mmol/L (ref 101–111)
Creatinine, Ser: 0.75 mg/dL (ref 0.44–1.00)
GFR calc Af Amer: >60 mL/min (ref >60)
GFR calc non Af Amer: >60 mL/min (ref >60)
GLUCOSE: 89 mg/dL (ref 65–99)
POTASSIUM: 4.4 mmol/L (ref 3.5–5.1)
SODIUM: 135 mmol/L (ref 135–145)
TOTAL PROTEIN: 6.6 g/dL (ref 6.5–8.1)

## 2016-02-06 LAB — URINALYSIS, ROUTINE W REFLEX MICROSCOPIC
BILIRUBIN URINE: NEGATIVE
Glucose, UA: NEGATIVE mg/dL
Hgb urine dipstick: NEGATIVE
KETONES UR: NEGATIVE mg/dL
NITRITE: NEGATIVE
PH: 6 (ref 5.0–8.0)
Protein, ur: NEGATIVE mg/dL
Specific Gravity, Urine: 1.01 (ref 1.005–1.030)

## 2016-02-06 LAB — GC/CHLAMYDIA PROBE AMP (~~LOC~~) NOT AT ARMC
CHLAMYDIA, DNA PROBE: NEGATIVE
NEISSERIA GONORRHEA: NEGATIVE

## 2016-02-06 LAB — URINE MICROSCOPIC-ADD ON

## 2016-02-06 MED ORDER — TRIAMCINOLONE ACETONIDE 0.5 % EX OINT: 1.0000 "application " | TOPICAL_OINTMENT | Freq: Two times a day (BID) | CUTANEOUS | 1 refills | Status: DC

## 2016-02-06 MED ORDER — HYDROXYZINE HCL 25 MG PO TABS: 25.0000 mg | ORAL_TABLET | Freq: Four times a day (QID) | ORAL | 1 refills | Status: DC

## 2016-02-06 NOTE — MAU Note (Signed)
Pt presents to [facility]U with complaints of itching on her abdomen, arms, legs (enitre body). Noticed rash on her abdomen a couple of days ago. Denies any vaginal bleeding or abnormal discharge

## 2016-02-06 NOTE — MAU Provider Note (Signed)
  History     CSN: [id number]  Arrival date and time: 02/06/16 1833   None     Chief Complaint  Patient presents with  . Rash   HPI Ms Anastasia FiedlerHelms is a 20yo G2P0010 @ 35.3wks with di/di twins who presents for eval of onset itchy rash on her abd this week. Reports sporadic itching on arms and legs throughout the whole pregancy but has never had a rash until now. Denies itching on palms/soles. Denies ctx, leaking, bldg, or any other preg concerns. Preg remarkable for 1) GDM A1 and 2) GBS pos  OB History    Gravida Para Term Preterm AB Living   2 0 0 0 1 0   SAB TAB Ectopic Multiple Live Births   1              Past Medical History:  Diagnosis Date  . Medical history non-contributory   . Scoliosis     Past Surgical History:  Procedure Laterality Date  . TONSILLECTOMY      Family History  Problem Relation Age of Onset  . Heart disease Maternal Grandfather     Social History  Substance Use Topics  . Smoking status: Never Smoker  . Smokeless tobacco: Never Used  . Alcohol use No    Allergies: No Known Allergies  Prescriptions Prior to Admission  Medication Sig Dispense Refill Last Dose  . Prenatal Vit-Fe Fumarate-FA (PRENATAL MULTIVITAMIN) TABS tablet Take 1 tablet by mouth daily at 12 noon.   Taking    ROS No other pertinents other than what is listed in HPI Physical Exam   Blood pressure 112/83, pulse 103, temperature 98.3 F (36.8 C), resp. rate 18, last menstrual period 05/28/2015.  Physical Exam  Constitutional: She is oriented to person, place, and time. She appears well-developed.  Neck: Normal range of motion.  Cardiovascular:  Sl tachycardic  Respiratory: Effort normal.  Musculoskeletal: Normal range of motion.  Neurological: She is alert and oriented to person, place, and time.  Skin: Skin is warm and dry.  Sl raised rash on abd, +striae  Psychiatric: She has a normal mood and affect. Her behavior is normal. Thought content normal.    MAU Course   Procedures  MDM CMET and bile salts pending  Assessment and Plan  Twin preg @ 35.3wks PUPPS  D/C home Rx Vistaril and Kenalog 0.5% F/U next week as scheduled  Cam HaiSHAW, Robina Hamor CNM 02/06/2016, 9:17 PM

## 2016-02-06 NOTE — Discharge Instructions (Signed)
Pruritic Urticarial Papules and Plaques of Pregnancy  When you are pregnant, your body changes in many ways. That includes the skin. Rashes sometimes develop. One skin rash that can happen during pregnancy is called pruritic urticarial papules and plaques of pregnancy (PUPPP). The small red bumps sometimes form large plaques. These are very itchy. The rash usually appears in the last few weeks of pregnancy during the third trimester. Sometimes, it can occur shortly after giving birth. It goes away shortly after your baby is born. It does not harm you or your baby and will not leave scars on your skin. PUPPP is most common in first pregnancies or in those involving more than one baby. It usually will not return during later pregnancies.  CAUSES   The exact cause is unknown. However, it may be related to your skin stretching rapidly due to pregnancy.   SYMPTOMS   PUPPP symptoms include a very itchy rash. The rash often looks red and raised and is most often seen on the abdomen. It can spread to the legs, thighs, or arms. Sometimes tiny blisters form in the center of the rash patches. The skin around the rash is often pale.  DIAGNOSIS   To decide if you have PUPPP, your health care provider will perform a physical exam and ask questions about your symptoms. He or she may order blood tests to rule out other causes of the rash.  TREATMENT   The goal is to stop the itching and keep the rash from spreading. Usually, a cream is used to do this. However, treatment varies. Common options include medicines that relieve or lessen itching. Some medicines may be in the form of a cream or ointment, while others you may take by mouth (orally). The medicines are either corticosteroids or antihistamines. Treatment helps nearly all women with this rash. The creams, ointments, or pills should make your skin feel better fairly quickly.   HOME CARE INSTRUCTIONS   · Only take over-the-counter or prescription medicines as directed by your  health care provider.  · Apply any creams as directed by your health care provider.  · Do not scratch the rash.  · Wear loose clothing.  · Keep all follow-up appointments with your health care provider.  SEEK MEDICAL CARE IF:   · The itching does not go away after treatment.  · Your rash continues to spread.  · You are unable to sleep because of the irritation.     This information is not intended to replace advice given to you by your health care provider. Make sure you discuss any questions you have with your health care provider.     Document Released: 07/21/2009 Document Revised: 12/27/2012 Document Reviewed: 10/08/2012  Elsevier Interactive Patient Education ©2016 Elsevier Inc.

## 2016-02-06 NOTE — MAU Note (Signed)
Urine sent to lab 

## 2016-02-07 LAB — BILE ACIDS, TOTAL: BILE ACIDS TOTAL: 15.2 umol/L (ref 4.7–24.5)

## 2016-02-10 ENCOUNTER — Other Ambulatory Visit: Payer: Self-pay | Admitting: Obstetrics and Gynecology

## 2016-02-10 ENCOUNTER — Telehealth: Payer: Self-pay | Admitting: *Deleted

## 2016-02-10 ENCOUNTER — Encounter: Payer: Self-pay | Admitting: Obstetrics and Gynecology

## 2016-02-10 DIAGNOSIS — O26613 Liver and biliary tract disorders in pregnancy, third trimester: Secondary | ICD-10-CM

## 2016-02-10 DIAGNOSIS — K831 Obstruction of bile duct: Secondary | ICD-10-CM | POA: Insufficient documentation

## 2016-02-10 MED ORDER — URSODIOL 300 MG PO CAPS: 300.0000 mg | ORAL_CAPSULE | Freq: Three times a day (TID) | ORAL | 0 refills | Status: DC

## 2016-02-10 NOTE — Telephone Encounter (Signed)
Pt left message yesterday @ 1344 requesting results from labs performed over the weekend @ [facility]. Per chart review, pt has Cholestasis. Message sent to Dr. Jolayne Pantheronstant for RX advice. Will call pt back after her response.

## 2016-02-10 NOTE — Telephone Encounter (Signed)
Encounter opened in error

## 2016-02-10 NOTE — Telephone Encounter (Signed)
Patient called and left message on nurse line. Stated she had talked to a nurse and was told she had cholestasis, talked about inducing labor next week. She stated she does not feel comfortable waiting that long and wants to be induced as soon as possible. Wants a return call to discuss. Patient scheduled to see Dr Doristine CounterShenk tomorrow afternoon.

## 2016-02-10 NOTE — Telephone Encounter (Signed)
Response received from Dr. Jolayne Pantheronstant which included Rx and plan of care information.  Pt was called and given test result information, diagnosis of Cholestasis, need for Ursodiol and delivery @ 37 weeks. Pt voiced understanding and will have opportunity for questions during prenatal visit tomorrow. Rx sent to pharmacy.

## 2016-02-11 ENCOUNTER — Ambulatory Visit (INDEPENDENT_AMBULATORY_CARE_PROVIDER_SITE_OTHER): Payer: Medicaid Other | Admitting: Obstetrics and Gynecology

## 2016-02-11 ENCOUNTER — Encounter (HOSPITAL_COMMUNITY): Payer: Self-pay | Admitting: *Deleted

## 2016-02-11 ENCOUNTER — Telehealth (HOSPITAL_COMMUNITY): Payer: Self-pay | Admitting: *Deleted

## 2016-02-11 ENCOUNTER — Ambulatory Visit (HOSPITAL_COMMUNITY)
Admission: RE | Admit: 2016-02-11 | Discharge: 2016-02-11 | Disposition: A | Discharge status: Home / Self Care | Payer: Medicaid Other | Source: Ambulatory Visit | Attending: Obstetrics and Gynecology | Admitting: Obstetrics and Gynecology

## 2016-02-11 ENCOUNTER — Encounter (HOSPITAL_COMMUNITY): Payer: Self-pay

## 2016-02-11 DIAGNOSIS — O30043 Twin pregnancy, dichorionic/diamniotic, third trimester: Secondary | ICD-10-CM

## 2016-02-11 DIAGNOSIS — O26613 Liver and biliary tract disorders in pregnancy, third trimester: Secondary | ICD-10-CM

## 2016-02-11 DIAGNOSIS — O358XX0 Maternal care for other (suspected) fetal abnormality and damage, not applicable or unspecified: Secondary | ICD-10-CM | POA: Diagnosis not present

## 2016-02-11 DIAGNOSIS — K831 Obstruction of bile duct: Secondary | ICD-10-CM | POA: Diagnosis not present

## 2016-02-11 DIAGNOSIS — O24410 Gestational diabetes mellitus in pregnancy, diet controlled: Secondary | ICD-10-CM | POA: Insufficient documentation

## 2016-02-11 DIAGNOSIS — O0990 Supervision of high risk pregnancy, unspecified, unspecified trimester: Secondary | ICD-10-CM

## 2016-02-11 DIAGNOSIS — Z3A26 26 weeks gestation of pregnancy: Secondary | ICD-10-CM | POA: Diagnosis not present

## 2016-02-11 DIAGNOSIS — O359XX2 Maternal care for (suspected) fetal abnormality and damage, unspecified, fetus 2: Secondary | ICD-10-CM

## 2016-02-11 DIAGNOSIS — O26643 Intrahepatic cholestasis of pregnancy, third trimester: Secondary | ICD-10-CM

## 2016-02-11 LAB — POCT URINALYSIS DIP (DEVICE)
BILIRUBIN URINE: NEGATIVE
Glucose, UA: NEGATIVE mg/dL
HGB URINE DIPSTICK: NEGATIVE
KETONES UR: NEGATIVE mg/dL
Nitrite: NEGATIVE
PH: 6 (ref 5.0–8.0)
Protein, ur: NEGATIVE mg/dL
SPECIFIC GRAVITY, URINE: 1.01 (ref 1.005–1.030)
Urobilinogen, UA: 0.2 mg/dL (ref 0.0–1.0)

## 2016-02-11 NOTE — Progress Notes (Signed)
   PRENATAL VISIT NOTE  Subjective:  Katherine Munoz is a 20 y.o. G2P0010 at 1361w1d being seen today for ongoing prenatal care.  She is currently monitored for the following issues for this high-risk pregnancy and has Supervision of high-risk pregnancy; GDM (gestational diabetes mellitus); Dichorionic diamniotic twin gestation; GBS (group B streptococcus) UTI complicating pregnancy; twin b with enlarged. 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  Patient reports some mucussy vaginal discharge/mucus plug coming out, no thin watery discharge.  Contractions: Irregular. Vag. Bleeding: None.  Movement: Present. Denies leaking of fluid.   The following portions of the patient's history were reviewed and updated as appropriate: allergies, current medications, past family history, past medical history, past social history, past surgical history and problem list. Problem list updated.  Objective:   Vitals:   02/11/16 1445  BP: 122/80  Pulse: (!) 104  Weight: 158 lb 6.4 oz (71.8 kg)    Fetal Status: Fetal Heart Rate (bpm): NST   Movement: Present     General:  Alert, oriented and cooperative. Patient is in no acute distress.  Skin: Skin is warm and dry. No rash noted.   Cardiovascular: Normal heart rate noted  Respiratory: Normal respiratory effort, no problems with respiration noted  Abdomen: Soft, gravid, appropriate for gestational age. Pain/Pressure: Present     Pelvic:  Cervical exam deferred        Extremities: Normal range of motion.  Edema: None  Mental Status: Normal mood and affect. Normal behavior. Normal judgment and thought content.   NST reviewed and reactive for twin A and B  Urinalysis:      Assessment and Plan:  Pregnancy: G2P0010 at 7361w1d  1. Supervision of high risk pregnancy, antepartum Pt with Di/Di twin. Monitoring reassuring today. BPP 10/10 for both twins. Plan for delivery at 37 wks for ICP. Dicussed risks and  benefits of breech extraction of twin B. Pt is ok with proceeding with induction.  Term labor symptoms and general obstetric precautions including but not limited to vaginal bleeding, contractions, leaking of fluid and fetal movement were reviewed in detail with the patient. Please refer to After Visit Summary for other counseling recommendations.  No Follow-up on file.  Lorne SkeensNicholas Michael Craig Ionescu, MD

## 2016-02-11 NOTE — Patient Instructions (Signed)
Labor Induction Labor induction is when steps are taken to cause a pregnant woman to begin the labor process. Most women go into labor on their own between 37 weeks and 42 weeks of the pregnancy. When this does not happen or when there is a medical need, methods may be used to induce labor. Labor induction causes a pregnant woman's uterus to contract. It also causes the cervix to soften (ripen), open (dilate), and thin out (efface). Usually, labor is not induced before 39 weeks of the pregnancy unless there is a problem with the baby or mother.  Before inducing labor, your health care provider will consider a number of factors, including the following:  The medical condition of you and the baby.   How many weeks along you are.   The status of the baby's lung maturity.   The condition of the cervix.   The position of the baby.  WHAT ARE THE REASONS FOR LABOR INDUCTION? Labor may be induced for the following reasons:  The health of the baby or mother is at risk.   The pregnancy is overdue by 1 week or more.   The water breaks but labor does not start on its own.   The mother has a health condition or serious illness, such as high blood pressure, infection, placental abruption, or diabetes.  The amniotic fluid amounts are low around the baby.   The baby is distressed.  Convenience or wanting the baby to be born on a certain date is not a reason for inducing labor. WHAT METHODS ARE USED FOR LABOR INDUCTION? Several methods of labor induction may be used, such as:   Prostaglandin medicine. This medicine causes the cervix to dilate and ripen. The medicine will also start contractions. It can be taken by mouth or by inserting a suppository into the vagina.   Inserting a thin tube (catheter) with a balloon on the end into the vagina to dilate the cervix. Once inserted, the balloon is expanded with water, which causes the cervix to open.   Stripping the membranes. Your health  care provider separates amniotic sac tissue from the cervix, causing the cervix to be stretched and causing the release of a hormone called progesterone. This may cause the uterus to contract. It is often done during an office visit. You will be sent home to wait for the contractions to begin. You will then come in for an induction.   Breaking the water. Your health care provider makes a hole in the amniotic sac using a small instrument. Once the amniotic sac breaks, contractions should begin. This may still take hours to see an effect.   Medicine to trigger or strengthen contractions. This medicine is given through an IV access tube inserted into a vein in your arm.  All of the methods of induction, besides stripping the membranes, will be done in the hospital. Induction is done in the hospital so that you and the baby can be carefully monitored.  HOW LONG DOES IT TAKE FOR LABOR TO BE INDUCED? Some inductions can take up to 2-3 days. Depending on the cervix, it usually takes less time. It takes longer when you are induced early in the pregnancy or if this is your first pregnancy. If a mother is still pregnant and the induction has been going on for 2-3 days, either the mother will be sent home or a cesarean delivery will be needed. WHAT ARE THE RISKS ASSOCIATED WITH LABOR INDUCTION? Some of the risks of induction include:     Changes in fetal heart rate, such as too high, too low, or erratic.   Fetal distress.   Chance of infection for the mother and baby.   Increased chance of having a cesarean delivery.   Breaking off (abruption) of the placenta from the uterus (rare).   Uterine rupture (very rare).  When induction is needed for medical reasons, the benefits of induction may outweigh the risks. WHAT ARE SOME REASONS FOR NOT INDUCING LABOR? Labor induction should not be done if:   It is shown that your baby does not tolerate labor.   You have had previous surgeries on your  uterus, such as a myomectomy or the removal of fibroids.   Your placenta lies very low in the uterus and blocks the opening of the cervix (placenta previa).   Your baby is not in a head-down position.   The umbilical cord drops down into the birth canal in front of the baby. This could cut off the baby's blood and oxygen supply.   You have had a previous cesarean delivery.   There are unusual circumstances, such as the baby being extremely premature.    This information is not intended to replace advice given to you by your health care provider. Make sure you discuss any questions you have with your health care provider.   Document Released: 09/15/2006 Document Revised: 05/17/2014 Document Reviewed: 11/23/2012 Elsevier Interactive Patient Education 2016 Elsevier Inc.  

## 2016-02-11 NOTE — Telephone Encounter (Signed)
Preadmission screen  

## 2016-02-12 NOTE — Telephone Encounter (Signed)
Pt had prenatal visit and NST yesterday - plan of care discussed including induction of labor on 10/11 @ midnight.  Pt voiced understanding and agreed to plan of care.

## 2016-02-18 ENCOUNTER — Inpatient Hospital Stay (HOSPITAL_COMMUNITY): Payer: Medicaid Other | Admitting: Anesthesiology

## 2016-02-18 ENCOUNTER — Ambulatory Visit (HOSPITAL_COMMUNITY): Payer: Medicaid Other

## 2016-02-18 ENCOUNTER — Inpatient Hospital Stay (HOSPITAL_COMMUNITY)
Admission: RE | Admit: 2016-02-18 | Discharge: 2016-02-20 | DRG: 775 | Disposition: A | Discharge status: Home / Self Care | Payer: Medicaid Other | Source: Ambulatory Visit | Attending: Family Medicine | Admitting: Family Medicine

## 2016-02-18 ENCOUNTER — Encounter (HOSPITAL_COMMUNITY): Payer: Self-pay

## 2016-02-18 ENCOUNTER — Other Ambulatory Visit: Payer: Medicaid Other | Admitting: Obstetrics and Gynecology

## 2016-02-18 DIAGNOSIS — O30043 Twin pregnancy, dichorionic/diamniotic, third trimester: Secondary | ICD-10-CM | POA: Diagnosis present

## 2016-02-18 DIAGNOSIS — K831 Obstruction of bile duct: Secondary | ICD-10-CM | POA: Diagnosis present

## 2016-02-18 DIAGNOSIS — O26613 Liver and biliary tract disorders in pregnancy, third trimester: Secondary | ICD-10-CM

## 2016-02-18 DIAGNOSIS — Z3A37 37 weeks gestation of pregnancy: Secondary | ICD-10-CM

## 2016-02-18 DIAGNOSIS — O321XX0 Maternal care for breech presentation, not applicable or unspecified: Secondary | ICD-10-CM

## 2016-02-18 DIAGNOSIS — O30049 Twin pregnancy, dichorionic/diamniotic, unspecified trimester: Secondary | ICD-10-CM | POA: Diagnosis present

## 2016-02-18 DIAGNOSIS — O2662 Liver and biliary tract disorders in childbirth: Principal | ICD-10-CM | POA: Diagnosis present

## 2016-02-18 DIAGNOSIS — S01512A Laceration without foreign body of oral cavity, initial encounter: Secondary | ICD-10-CM | POA: Diagnosis not present

## 2016-02-18 DIAGNOSIS — O24420 Gestational diabetes mellitus in childbirth, diet controlled: Secondary | ICD-10-CM | POA: Diagnosis present

## 2016-02-18 DIAGNOSIS — O9962 Diseases of the digestive system complicating childbirth: Secondary | ICD-10-CM | POA: Diagnosis present

## 2016-02-18 DIAGNOSIS — O2402 Pre-existing diabetes mellitus, type 1, in childbirth: Secondary | ICD-10-CM

## 2016-02-18 DIAGNOSIS — O2340 Unspecified infection of urinary tract in pregnancy, unspecified trimester: Secondary | ICD-10-CM

## 2016-02-18 DIAGNOSIS — K219 Gastro-esophageal reflux disease without esophagitis: Secondary | ICD-10-CM | POA: Diagnosis present

## 2016-02-18 DIAGNOSIS — B951 Streptococcus, group B, as the cause of diseases classified elsewhere: Secondary | ICD-10-CM | POA: Diagnosis present

## 2016-02-18 DIAGNOSIS — O328XX2 Maternal care for other malpresentation of fetus, fetus 2: Secondary | ICD-10-CM | POA: Diagnosis present

## 2016-02-18 DIAGNOSIS — Z8249 Family history of ischemic heart disease and other diseases of the circulatory system: Secondary | ICD-10-CM

## 2016-02-18 DIAGNOSIS — O99824 Streptococcus B carrier state complicating childbirth: Secondary | ICD-10-CM

## 2016-02-18 DIAGNOSIS — M419 Scoliosis, unspecified: Secondary | ICD-10-CM | POA: Diagnosis present

## 2016-02-18 DIAGNOSIS — O283 Abnormal ultrasonic finding on antenatal screening of mother: Secondary | ICD-10-CM | POA: Diagnosis present

## 2016-02-18 DIAGNOSIS — E109 Type 1 diabetes mellitus without complications: Secondary | ICD-10-CM

## 2016-02-18 DIAGNOSIS — O24419 Gestational diabetes mellitus in pregnancy, unspecified control: Secondary | ICD-10-CM | POA: Diagnosis present

## 2016-02-18 DIAGNOSIS — O26643 Intrahepatic cholestasis of pregnancy, third trimester: Secondary | ICD-10-CM | POA: Diagnosis present

## 2016-02-18 DIAGNOSIS — O0990 Supervision of high risk pregnancy, unspecified, unspecified trimester: Secondary | ICD-10-CM

## 2016-02-18 LAB — CBC
HEMATOCRIT: 29.8 % — AB (ref 36.0–46.0)
HEMOGLOBIN: 9.5 g/dL — AB (ref 12.0–15.0)
MCH: 23.8 pg — ABNORMAL LOW (ref 26.0–34.0)
MCHC: 31.9 g/dL (ref 30.0–36.0)
MCV: 74.5 fL — ABNORMAL LOW (ref 78.0–100.0)
Platelets: 230 10*3/uL (ref 150–400)
RBC: 4 MIL/uL (ref 3.87–5.11)
RDW: 17.3 % — ABNORMAL HIGH (ref 11.5–15.5)
WBC: 11 10*3/uL — AB (ref 4.0–10.5)

## 2016-02-18 LAB — GLUCOSE, CAPILLARY
GLUCOSE-CAPILLARY: 94 mg/dL (ref 65–99)
Glucose-Capillary: 70 mg/dL (ref 65–99)
Glucose-Capillary: 83 mg/dL (ref 65–99)
Glucose-Capillary: 55 mg/dL — ABNORMAL LOW (ref 65–99)

## 2016-02-18 LAB — ABO/RH: ABO/RH(D): O POS

## 2016-02-18 LAB — TYPE AND SCREEN
ABO/RH(D): O POS
ANTIBODY SCREEN: NEGATIVE

## 2016-02-18 LAB — RPR: RPR Ser Ql: NONREACTIVE

## 2016-02-18 MED ORDER — DIPHENHYDRAMINE HCL 50 MG/ML IJ SOLN: 12.5000 mg | INTRAMUSCULAR | Status: DC | PRN

## 2016-02-18 MED ORDER — ONDANSETRON HCL 4 MG/2ML IJ SOLN: 4.0000 mg | Freq: Four times a day (QID) | INTRAMUSCULAR | Status: DC | PRN

## 2016-02-18 MED ORDER — PRENATAL MULTIVITAMIN CH
1.0000 | ORAL_TABLET | Freq: Every day | ORAL | Status: DC
Start: 2016-02-19 — End: 2016-02-20
  Administered 2016-02-19: 1 via ORAL
  Filled 2016-02-18: qty 1

## 2016-02-18 MED ORDER — LIDOCAINE HCL (PF) 1 % IJ SOLN
INTRAMUSCULAR | Status: DC | PRN
  Administered 2016-02-18: 4 mL via EPIDURAL
  Administered 2016-02-18: 6 mL via EPIDURAL

## 2016-02-18 MED ORDER — TERBUTALINE SULFATE 1 MG/ML IJ SOLN
0.2500 mg | Freq: Once | INTRAMUSCULAR | Status: DC | PRN
Start: 2016-02-18 — End: 2016-02-18
  Filled 2016-02-18: qty 1

## 2016-02-18 MED ORDER — OXYTOCIN BOLUS FROM INFUSION
500.0000 mL | Freq: Once | INTRAVENOUS | Status: AC
  Administered 2016-02-18: 500 mL via INTRAVENOUS

## 2016-02-18 MED ORDER — WITCH HAZEL-GLYCERIN EX PADS: 1.0000 "application " | MEDICATED_PAD | CUTANEOUS | Status: DC | PRN

## 2016-02-18 MED ORDER — BENZOCAINE-MENTHOL 20-0.5 % EX AERO
1.0000 "application " | INHALATION_SPRAY | CUTANEOUS | Status: DC | PRN
  Administered 2016-02-18: 1 via TOPICAL
  Filled 2016-02-18: qty 56

## 2016-02-18 MED ORDER — EPHEDRINE 5 MG/ML INJ: 10.0000 mg | INTRAVENOUS | Status: DC | PRN

## 2016-02-18 MED ORDER — URSODIOL 300 MG PO CAPS
300.0000 mg | ORAL_CAPSULE | Freq: Three times a day (TID) | ORAL | Status: DC
  Administered 2016-02-19 (×3): 300 mg via ORAL
  Filled 2016-02-18 (×8): qty 1

## 2016-02-18 MED ORDER — SIMETHICONE 80 MG PO CHEW: 80.0000 mg | CHEWABLE_TABLET | ORAL | Status: DC | PRN

## 2016-02-18 MED ORDER — OXYTOCIN 40 UNITS IN LACTATED RINGERS INFUSION - SIMPLE MED
1.0000 m[IU]/min | INTRAVENOUS | Status: DC
  Administered 2016-02-18: 2 m[IU]/min via INTRAVENOUS

## 2016-02-18 MED ORDER — FLEET ENEMA 7-19 GM/118ML RE ENEM: 1.0000 | ENEMA | RECTAL | Status: DC | PRN

## 2016-02-18 MED ORDER — ACETAMINOPHEN 325 MG PO TABS: 650.0000 mg | ORAL_TABLET | ORAL | Status: DC | PRN

## 2016-02-18 MED ORDER — IBUPROFEN 600 MG PO TABS
600.0000 mg | ORAL_TABLET | Freq: Four times a day (QID) | ORAL | Status: DC
  Administered 2016-02-18 – 2016-02-20 (×7): 600 mg via ORAL
  Filled 2016-02-18 (×7): qty 1

## 2016-02-18 MED ORDER — ONDANSETRON HCL 4 MG/2ML IJ SOLN: 4.0000 mg | INTRAMUSCULAR | Status: DC | PRN

## 2016-02-18 MED ORDER — PHENYLEPHRINE 40 MCG/ML (10ML) SYRINGE FOR IV PUSH (FOR BLOOD PRESSURE SUPPORT)
80.0000 ug | PREFILLED_SYRINGE | INTRAVENOUS | Status: DC | PRN
  Filled 2016-02-18: qty 5

## 2016-02-18 MED ORDER — SOD CITRATE-CITRIC ACID 500-334 MG/5ML PO SOLN
30.0000 mL | ORAL | Status: DC | PRN
  Administered 2016-02-18: 30 mL via ORAL
  Filled 2016-02-18: qty 15

## 2016-02-18 MED ORDER — COCONUT OIL OIL: 1.0000 "application " | TOPICAL_OIL | Status: DC | PRN

## 2016-02-18 MED ORDER — FENTANYL 2.5 MCG/ML BUPIVACAINE 1/10 % EPIDURAL INFUSION (WH - ANES)
INTRAMUSCULAR | Status: AC
  Filled 2016-02-18: qty 125

## 2016-02-18 MED ORDER — OXYCODONE-ACETAMINOPHEN 5-325 MG PO TABS: 2.0000 | ORAL_TABLET | ORAL | Status: DC | PRN

## 2016-02-18 MED ORDER — FENTANYL CITRATE (PF) 100 MCG/2ML IJ SOLN: 50.0000 ug | INTRAMUSCULAR | Status: DC | PRN

## 2016-02-18 MED ORDER — OXYCODONE-ACETAMINOPHEN 5-325 MG PO TABS: 1.0000 | ORAL_TABLET | ORAL | Status: DC | PRN

## 2016-02-18 MED ORDER — EPHEDRINE 5 MG/ML INJ
10.0000 mg | INTRAVENOUS | Status: DC | PRN
  Filled 2016-02-18: qty 4

## 2016-02-18 MED ORDER — LACTATED RINGERS IV SOLN: 500.0000 mL | INTRAVENOUS | Status: DC | PRN

## 2016-02-18 MED ORDER — FENTANYL 2.5 MCG/ML BUPIVACAINE 1/10 % EPIDURAL INFUSION (WH - ANES)
14.0000 mL/h | INTRAMUSCULAR | Status: DC | PRN
  Administered 2016-02-18 (×2): 14 mL/h via EPIDURAL
  Filled 2016-02-18: qty 125

## 2016-02-18 MED ORDER — DIBUCAINE 1 % RE OINT: 1.0000 "application " | TOPICAL_OINTMENT | RECTAL | Status: DC | PRN

## 2016-02-18 MED ORDER — OXYTOCIN 40 UNITS IN LACTATED RINGERS INFUSION - SIMPLE MED
2.5000 [IU]/h | INTRAVENOUS | Status: DC
  Filled 2016-02-18: qty 1000

## 2016-02-18 MED ORDER — DIPHENHYDRAMINE HCL 25 MG PO CAPS: 25.0000 mg | ORAL_CAPSULE | Freq: Four times a day (QID) | ORAL | Status: DC | PRN

## 2016-02-18 MED ORDER — ONDANSETRON HCL 4 MG PO TABS: 4.0000 mg | ORAL_TABLET | ORAL | Status: DC | PRN

## 2016-02-18 MED ORDER — PENICILLIN G POTASSIUM 5000000 UNITS IJ SOLR
2.5000 10*6.[IU] | INTRAVENOUS | Status: DC
  Administered 2016-02-18 (×3): 2.5 10*6.[IU] via INTRAVENOUS
  Filled 2016-02-18 (×6): qty 2.5

## 2016-02-18 MED ORDER — DEXTROSE 5 % IV SOLN
5.0000 10*6.[IU] | Freq: Once | INTRAVENOUS | Status: AC
  Administered 2016-02-18: 5 10*6.[IU] via INTRAVENOUS
  Filled 2016-02-18: qty 5

## 2016-02-18 MED ORDER — LIDOCAINE HCL (PF) 1 % IJ SOLN
30.0000 mL | INTRAMUSCULAR | Status: AC | PRN
  Administered 2016-02-18: 1.5 mL via SUBCUTANEOUS
  Administered 2016-02-18: 30 mL via SUBCUTANEOUS
  Filled 2016-02-18 (×2): qty 30

## 2016-02-18 MED ORDER — HYDROXYZINE HCL 50 MG PO TABS
50.0000 mg | ORAL_TABLET | Freq: Four times a day (QID) | ORAL | Status: DC | PRN
  Filled 2016-02-18: qty 1

## 2016-02-18 MED ORDER — DOCUSATE SODIUM 100 MG PO CAPS
100.0000 mg | ORAL_CAPSULE | Freq: Two times a day (BID) | ORAL | Status: DC
  Administered 2016-02-19 – 2016-02-20 (×4): 100 mg via ORAL
  Filled 2016-02-18 (×4): qty 1

## 2016-02-18 MED ORDER — LACTATED RINGERS IV SOLN: 500.0000 mL | Freq: Once | INTRAVENOUS | Status: DC

## 2016-02-18 MED ORDER — PHENYLEPHRINE 40 MCG/ML (10ML) SYRINGE FOR IV PUSH (FOR BLOOD PRESSURE SUPPORT)
PREFILLED_SYRINGE | INTRAVENOUS | Status: AC
  Filled 2016-02-18: qty 20

## 2016-02-18 MED ORDER — PHENYLEPHRINE 40 MCG/ML (10ML) SYRINGE FOR IV PUSH (FOR BLOOD PRESSURE SUPPORT): 80.0000 ug | PREFILLED_SYRINGE | INTRAVENOUS | Status: DC | PRN

## 2016-02-18 MED ORDER — LACTATED RINGERS IV SOLN
INTRAVENOUS | Status: DC
  Administered 2016-02-18: 02:00:00 via INTRAVENOUS

## 2016-02-18 MED ORDER — LACTATED RINGERS IV SOLN
500.0000 mL | Freq: Once | INTRAVENOUS | Status: AC
  Administered 2016-02-18: 500 mL via INTRAVENOUS

## 2016-02-18 NOTE — Progress Notes (Signed)
OB Note Patient is cephalic and breech with less than 20% discordance on last u/s; subjectively normal fluid and normal FHR x 2. D/w them modes of delivery and pros and cons of each, as well as the IOL process and they are amenable to proceeding with TOL. Cervix to be checked and IOL started momentarily.   Katherine Munoz, Jr MD Attending [facility](Faculty Practice)

## 2016-02-18 NOTE — Lactation Note (Signed)
This note was copied from a baby's chart. Lactation Consultation Note  Patient Name: [name] [id number]'s Date: 02/18/2016 Reason for consult: Initial assessment   With this baby A of Twins, [place], 1 hours old, and 37 1/[redacted] weeks gestation, weight 6 lbs 2.9 oz Baby in football hold when I walked in the room. I assisted mom with latching baby.  He latches eagerly and deeply, with visible swallows, mom has  lots of easily expressed colostrum.  LPI information given to parents, and explained that although the baby's are early term, a lot of the LPI information will apply to these babies. Mom told a DEP will be set up for her when she gets to her room, and I explained to dad that the babies should be fed at least every 3 hours, or with cues. Dad admits to having lots of questions, but will ask them later. I left babies skin to skin with mom, and she knows to call for questions/concerns.    Maternal Data Formula Feeding for Exclusion: No Has patient been taught Hand Expression?: Yes Does the patient have breastfeeding experience prior to this delivery?: No  Feeding Feeding Type: Breast Fed Length of feed: 17 min  LATCH Score/Interventions Latch: Grasps breast easily, tongue down, lips flanged, rhythmical sucking.  Audible Swallowing: Spontaneous and intermittent  Type of Nipple: Everted at rest and after stimulation  Comfort (Breast/Nipple): Soft / non-tender     Hold (Positioning): Assistance needed to correctly position infant at breast and maintain latch. Intervention(s): Breastfeeding basics reviewed;Position options;Skin to skin  LATCH Score: 9  Lactation Tools Discussed/Used     Consult Status Consult Status: Follow-up Date: 02/19/16 Follow-up type: In-patient    Katherine Munoz, Katherine Munoz 02/18/2016, 5:52 PM

## 2016-02-18 NOTE — Lactation Note (Signed)
This note was copied from a baby's chart. Lactation Consultation Note  Patient Name: [name] [id number]'s Date: 02/18/2016 Reason for consult: Follow-up assessment Babies at 7 hr of life. Baby B was too sleepy to latch.  Baby A will latch for short bursts of sucking then fall asleep. Demonstrated waking techniques.  Mom will offer the breast 8+/24hr. If babies will not latch she will manually express and spoon feed. Discussed trying a NS and start using DEBP if babies can not maintain a latch for longer bursts of sucking. Discussed LPT infant behavior and supplementing guidelines. She is aware of lactation services and support group. She will call for help at the next feeding.   Maternal Data    Feeding Feeding Type: Breast Fed Length of feed: 15 min  LATCH Score/Interventions Latch: Repeated attempts needed to sustain latch, nipple held in mouth throughout feeding, stimulation needed to elicit sucking reflex. Intervention(s): Adjust position;Assist with latch;Breast massage;Breast compression  Audible Swallowing: None  Type of Nipple: Everted at rest and after stimulation  Comfort (Breast/Nipple): Soft / non-tender     Hold (Positioning): Assistance needed to correctly position infant at breast and maintain latch. Intervention(s): Support Pillows;Position options  LATCH Score: 6  Lactation Tools Discussed/Used     Consult Status Consult Status: Follow-up Date: 02/19/16 Follow-up type: In-patient    Rulon Eisenmengerlizabeth E Michiah Mudry 02/18/2016, 11:01 PM

## 2016-02-18 NOTE — Consult Note (Signed)
Neonatology Note:   Attendance at Delivery:    I was asked by Dr. Alvester MorinNewton to attend this vaginal delivery of twins at 37 weeks.  The mother is a G2P0010, GBS + with adequate IAP and good prenatal care. Pregnancy also complicated by IOL for maternal cholestasis, A1DM (well controlled) and baby B with enlarged cisterna magna (will need HUS in NBN) and h/o arrythmia.  ROM Tw A 8hrs and Tw B at delivery, fluid clear.   Twin A vigorous with good spontaneous cry and tone. Needed only minimal bulb suctioning. Skin color slow to change.  Pulse oximetry placed and appropriate for GA.  Ap 8/9. Lungs clear to ausc in DR.   Twin B not vigorous nor with good spontaneous cry or tone. Brought to warmer and immediately warm, dried and stimulated then CPAP started.  HR 60-80 bpm and slowing.  PPV started with transient response with transition to CPAP.  Repositioned and PPV started again and continued for ~1 minute until infant respiratory effort sufficed to maintain HR >60 at which point infant began crying well and tone and grimace improved.  Pulse oximetry placed and appropriate SaO2 maintained.  CPAP weaned off by 5-6 minutes. Lungs originally coarse now clear to ausc in DR. Ap 1/9.  To CN to care of Pediatrician.     Dineen Kidavid C. Leary RocaEhrmann, MD

## 2016-02-18 NOTE — H&P (Signed)
Katherine Munoz is a 20 y.o. G76P0010 female at [redacted]w[redacted]d by 29wk u/s, presenting for IOL d/t cholestasis of pregnancy with dichorionic-diamniotic twin boys, and A1DM.   Reports active fetal movement x 2, contractions: none, vaginal bleeding: none, membranes: intact. Initiated prenatal care at [facility] at 32.6 wks as a transfer from [facility].   Most recent u/s 9/20 @ 34.1, Baby A 2593g/5lb11oz/77%, Baby B 2171g/4lb13oz/44% w/ enlarged cisterna magna and arrythmia, 16% discordance 02/11/16 @ 36.1wks baby A vtx, baby B breech.   This pregnancy complicated by: 1) di-di twin gestation 2) cholestasis of pregnancy 3) A1DM, well controlled 4) GBS uti earlier in pregnancy 5) Baby B w/ enlarged cisterna magna and arrythmia  Prenatal History/Complications:  SAB 2016  Past Medical History: Past Medical History:  Diagnosis Date  . Cholestasis during pregnancy   . Medical history non-contributory   . Scoliosis     Past Surgical History: Past Surgical History:  Procedure Laterality Date  . TONSILLECTOMY      Obstetrical History: OB History    Gravida Para Term Preterm AB Living   2 0 0 0 1 0   SAB TAB Ectopic Multiple Live Births   1              Social History: Social History   Social History  . Marital status: Single    Spouse name: N/A  . Number of children: N/A  . Years of education: N/A   Social History Main Topics  . Smoking status: Never Smoker  . Smokeless tobacco: Never Used  . Alcohol use No  . Drug use: No  . Sexual activity: Not Currently   Other Topics Concern  . Not on file   Social History Narrative  . No narrative on file    Family History: Family History  Problem Relation Age of Onset  . Heart disease Maternal Grandfather     Allergies: No Known Allergies  Prescriptions Prior to Admission  Medication Sig Dispense Refill Last Dose  . hydrOXYzine (ATARAX/VISTARIL) 25 MG tablet Take 1 tablet (25 mg total) by mouth every 6 (six) hours. 30  tablet 1 02/17/2016 at Unknown time  . Prenatal Vit-Fe Fumarate-FA (PRENATAL MULTIVITAMIN) TABS tablet Take 1 tablet by mouth daily at 12 noon.   Taking  . triamcinolone ointment (KENALOG) 0.5 % Apply 1 application topically 2 (two) times daily. 30 g 1 Taking  . ursodiol (ACTIGALL) 300 MG capsule Take 1 capsule (300 mg total) by mouth 3 (three) times daily. 24 capsule 0 Taking    Review of Systems  Pertinent pos/neg as indicated in HPI  Temperature 98.6 F (37 C), temperature source Oral, resp. rate 18, height 5\' 3"  (1.6 m), weight 70.3 kg (155 lb), last menstrual period 05/28/2015. General appearance: alert, cooperative and no distress Lungs: clear to auscultation bilaterally Heart: regular rate and rhythm Abdomen: gravid, soft, non-tender Extremities: No edema DTR's 2+  Fetal monitoring: A FHR: 145 bpm, variability: moderate,  Accelerations: Present,  decelerations:  Absent Baby B: FHR 135, moderate variability, +accels, no decels Uterine activity: None Dilation: 3 Effacement (%): 80 Station: -2 Exam by:: [employer] CNM Presentation: Baby A Vtx by u/s and confirmed by exam; Baby B breech by u/s by Dr. Vergie Living   Prenatal labs: ABO, Rh: O/Positive/-- (08/14 0000) Antibody: Negative (08/14 0000) Rubella: !Error! RPR: Nonreactive (08/14 0000)  HBsAg: Negative (08/14 0000)  HIV: Non-reactive (08/14 0000)  GBS:   + urine  1 hr Glucola: 161 Genetic screening:  declined Anatomy  US: normal female x 2, Baby B w/ enlarged cisterna magna on later u/s  No results found for this or any previous visit (from the past 24 hour(s)).   Assessment:  1655w1d SIUP  G2P0010  IOL d/t cholestasis of pregnancy  Cat 1 FHR x 2  Di-Di Twin gestation, vtx/breech  A1DM  GBS  Pos- urine  Baby B enlarged cisterna magna/arrythmia  Plan:  Admit to BS  Risks/benefits of vaginal twin birth w/ breech extraction of baby b vs. Elective primary c/s discussed w/ pt/fob, pt desires vaginal birth  IV pain  meds/epidural prn active labor  Pitocin per protocol   PCN per protocol for GBS+  CBGs q 4hr latent/q2hr active  Anticipate NSVB   Baby B will need neuroimaging post birth  Plans to breastfeed  Contraception: abstinence until married (no date set yet)  Circumcision: yes, outpatient x 2  Marge DuncansBooker, Kimberly Randall CNM, WHNP-BC 02/18/2016, 1:24 AM

## 2016-02-18 NOTE — Lactation Note (Signed)
This note was copied from a baby's chart. Lactation Consultation Note  Patient Name: Katherine Munoz Reason for consult: Initial assessment   With this baby, a twin B, now 1 hours old, and at the breast when I walked in the L&D room. Mom was breastfeeding both babies at the same time, with help from her nurse and family. Genia PlantsMalan was more sleepy than his brother. He latches eagerly, strong suckles for a minute, then either off  the breast or asleep. Mom's colostrum was flowing easily, so I would think he transferred a good amount. He is the smaller of the two, weighing 5 lbs 14.2 ounces. Mom knows to call for questions/concerns. See Twin A's note for further information.    Maternal Data Formula Feeding for Exclusion: No Has patient been taught Hand Expression?: Yes Does the patient have breastfeeding experience prior to this delivery?: No  Feeding Feeding Type: Breast Fed Length of feed: 20 min (on and off)  LATCH Score/Interventions Latch: Repeated attempts needed to sustain latch, nipple held in mouth throughout feeding, stimulation needed to elicit sucking reflex. Intervention(s): Adjust position;Assist with latch;Breast massage;Breast compression  Audible Swallowing: Spontaneous and intermittent (lots of easily hand expressed colostrum)  Type of Nipple: Everted at rest and after stimulation  Comfort (Breast/Nipple): Soft / non-tender     Hold (Positioning): Assistance needed to correctly position infant at breast and maintain latch. Intervention(s): Breastfeeding basics reviewed;Support Pillows;Skin to skin  LATCH Score: 8  Lactation Tools Discussed/Used     Consult Status Consult Status: Follow-up Date: 02/19/16 Follow-up type: In-patient    Katherine Munoz, Katherine Munoz Munoz, 6:00 PM

## 2016-02-18 NOTE — Anesthesia Preprocedure Evaluation (Signed)
Anesthesia Evaluation  Patient identified by MRN, date of birth, ID band Patient awake    Reviewed: Allergy & Precautions, NPO status , Patient's Chart, lab work & pertinent test results  History of Anesthesia Complications Negative for: history of anesthetic complications  Airway Mallampati: I  TM Distance: >3 FB Neck ROM: Full    Dental  (+) Teeth Intact   Pulmonary neg pulmonary ROS,    Pulmonary exam normal breath sounds clear to auscultation       Cardiovascular negative cardio ROS   Rhythm:Regular Rate:Normal     Neuro/Psych negative neurological ROS     GI/Hepatic Neg liver ROS, GERD  ,Cholestasis during pregnancy   Endo/Other  diabetes (gestational), Gestational  Renal/GU negative Renal ROS     Musculoskeletal scoliosis   Abdominal   Peds  Hematology negative hematology ROS (+)   Anesthesia Other Findings Di-di twins. Twin B with enlarge cisterna magna.  Reproductive/Obstetrics (+) Pregnancy                             Anesthesia Physical Anesthesia Plan  ASA: II  Anesthesia Plan: Epidural   Post-op Pain Management:    Induction:   Airway Management Planned: Natural Airway  Additional Equipment:   Intra-op Plan:   Post-operative Plan:   Informed Consent: I have reviewed the patients History and Physical, chart, labs and discussed the procedure including the risks, benefits and alternatives for the proposed anesthesia with the patient or authorized representative who has indicated his/her understanding and acceptance.     Plan Discussed with:   Anesthesia Plan Comments: (I have discussed risks of neuraxial anesthesia including but not limited to infection, bleeding, nerve injury, back pain, headache, seizures, and failure of block. Patient denies bleeding disorders and is not currently anticoagulated. Labs have been reviewed. Risks and benefits discussed. All  patient's questions answered.   Platelets 230)        Anesthesia Quick Evaluation

## 2016-02-18 NOTE — Anesthesia Pain Management Evaluation Note (Signed)
  CRNA Pain Management Visit Note  Patient: Katherine LoanCaitlin Munoz, 20 y.o., female  "Hello I am a member of the anesthesia team at [facility]. We have an anesthesia team available at all times to provide care throughout the hospital, including epidural management and anesthesia for C-section. I don't know your plan for the delivery whether it a natural birth, water birth, IV sedation, nitrous supplementation, doula or epidural, but we want to meet your pain goals."   1.Was your pain managed to your expectations on prior hospitalizations?   Yes   2.What is your expectation for pain management during this hospitalization?     Epidural  3.How can we help you reach that goal? Epidural in place   Record the patient's initial score and the patient's pain goal.   Pain: 3  Pain Goal: 3 [facility] wants you to be able to say your pain was always managed very well.  Katherine Munoz 02/18/2016

## 2016-02-18 NOTE — Anesthesia Procedure Notes (Signed)
Epidural Patient location during procedure: OB Start time: 02/18/2016 6:00 AM End time: 02/18/2016 6:07 AM  Staffing Anesthesiologist: Linton RumpALLAN, Jaymar Loeber DICKERSON Performed: anesthesiologist   Preanesthetic Checklist Completed: patient identified, surgical consent, pre-op evaluation, timeout performed, IV checked, risks and benefits discussed and monitors and equipment checked  Epidural Patient position: sitting Prep: site prepped and draped and DuraPrep Patient monitoring: continuous pulse ox and blood pressure Approach: midline Location: L2-L3 Injection technique: LOR air  Needle:  Needle type: Tuohy  Needle gauge: 17 G Needle length: 9 cm and 9 Needle insertion depth: 5 cm cm Catheter type: closed end flexible Catheter size: 19 Gauge Catheter at skin depth: 10 cm Test dose: negative  Assessment Events: blood not aspirated, injection not painful, no injection resistance, negative IV test and no paresthesia  Additional Notes Epidural catheter placed easily on first attempt.Reason for block:procedure for pain

## 2016-02-18 NOTE — Lactation Note (Signed)
This note was copied from a baby's chart. Lactation Consultation Note  Patient Name: [name] ZOXWR'UToday's Date: 02/18/2016 Reason for consult: Follow-up assessment Babies at 5 hr of life. Lab was working with A and B was having a nursing assessment done. Mom stated babies latched well but she only wants to bf 1 at a time from now on because doing both is "too much". She declined DEBP because "that's too much right now". She denies breast or nipple pain, voiced no concerns. Discussed baby behavior, feeding frequency, voids, wt loss, breast changes, and nipple care. She will call for lactation at next feeding. She is aware of lactation services and support group.    Maternal Data    Feeding Feeding Type: Breast Fed Length of feed: 15 min  LATCH Score/Interventions                      Lactation Tools Discussed/Used     Consult Status Consult Status: Follow-up Date: 02/19/16 Follow-up type: In-patient    Rulon Eisenmengerlizabeth E Floy Angert 02/18/2016, 9:37 PM

## 2016-02-18 NOTE — Progress Notes (Signed)
After in and out cath, pt labial tear repair broke apart. Returned to patients bedside and placed a single interrupted suture in the right labia. After infiltrating 2cc lidocaine. Pt tolerated the procedure well.

## 2016-02-19 LAB — GLUCOSE, CAPILLARY: GLUCOSE-CAPILLARY: 81 mg/dL (ref 65–99)

## 2016-02-19 MED ORDER — TRAMADOL HCL 50 MG PO TABS
50.0000 mg | ORAL_TABLET | Freq: Four times a day (QID) | ORAL | Status: DC | PRN
  Administered 2016-02-19 (×2): 50 mg via ORAL
  Filled 2016-02-19 (×2): qty 1

## 2016-02-19 MED ORDER — MEASLES, MUMPS & RUBELLA VAC ~~LOC~~ INJ
0.5000 mL | INJECTION | Freq: Once | SUBCUTANEOUS | Status: AC
  Administered 2016-02-20: 0.5 mL via SUBCUTANEOUS
  Filled 2016-02-19: qty 0.5

## 2016-02-19 NOTE — Plan of Care (Signed)
Problem: Nutritional: Goal: Mother's verbalization of comfort with breastfeeding process will improve Encouraged to hand express before each feeding and or pump as needed.

## 2016-02-19 NOTE — Progress Notes (Signed)
Post Partum Day #1 Subjective: up ad lib, voiding and tolerating PO; having some abd/back pain not helped by ibuprofen; breastfeeding; declines contraception  Objective: Blood pressure 124/79, pulse 95, temperature 98 F (36.7 C), temperature source Oral, resp. rate 18, height 5\' 3"  (1.6 m), weight 70.3 kg (155 lb), last menstrual period 05/28/2015, SpO2 99 %, unknown if currently breastfeeding.  Physical Exam:  General: alert and cooperative Lochia: appropriate Uterine Fundus: firm DVT Evaluation: No evidence of DVT seen on physical exam.   Recent Labs  02/18/16 0123  HGB 9.5*  HCT 29.8*    Assessment/Plan: Plan for discharge tomorrow  Ultram added   LOS: 1 day   Cam HaiSHAW, Elius Etheredge CNM 02/19/2016, 9:29 AM

## 2016-02-19 NOTE — Progress Notes (Signed)
UR chart review completed.  

## 2016-02-19 NOTE — Lactation Note (Signed)
This note was copied from a baby's chart. Lactation Consultation Note Mom worried d/t babies haven't latched onto breast and have been crying. Mom thinks they have gas. Mom has been hand expressing and spoon feeding the babies d/t will not latch onto breast. Mom had filling breast w/short shaft nipples and full areola. Breast softens when hand expressed colostrum. Noted nipple and areola more compressible after hand expression which LC feels babies would be able to obtain a deep latch when they decide to start feeding. Put baby to breast, cried and refused to suckle at breast. [facility] training done w/gloved finger and curve tip syring stimulated baby to suckle on finger w/colostrum. With much stimulation took 26m colostrum. Baby was satisfied after colostrum given. Gave mom shells to wear in bra to evert nipples more for deeper latch. Gave hand pump to evert nipples prior to latching and also when mom is filling or leaking to hand pump. Gave mom DEBP kit, but mom wants to not have to pump. Baby's is 37 1/7 but acts like 36 weekers.  Patient Name: Katherine CJannet CalipTKHVFM'BDate: 02/19/2016 Reason for consult: Follow-up assessment;Difficult latch   Maternal Data    Feeding Feeding Type: Breast Milk Length of feed: 0 min  LATCH Score/Interventions Latch: Too sleepy or reluctant, no latch achieved, no sucking elicited. Intervention(s): Skin to skin;Teach feeding cues;Waking techniques Intervention(s): Adjust position;Assist with latch;Breast massage;Breast compression  Audible Swallowing: None Intervention(s): Skin to skin;Hand expression  Type of Nipple: Everted at rest and after stimulation  Comfort (Breast/Nipple): Filling, red/small blisters or bruises, mild/mod discomfort  Problem noted: Filling Interventions (Filling): Hand pump;Massage;Reverse pressure;Firm support;Frequent nursing;Double electric pump  Hold (Positioning): Full assist, staff holds infant at breast Intervention(s):  Breastfeeding basics reviewed;Support Pillows;Position options;Skin to skin  LATCH Score: 3  Lactation Tools Discussed/Used Tools: Shells;Pump Shell Type: Inverted Breast pump type: Manual Pump Review: Setup, frequency, and cleaning;Milk Storage Initiated by:: LAllayne StackRN IBCLC Date initiated:: 02/19/16   Consult Status Consult Status: Follow-up Date: 02/19/16 Follow-up type: In-patient    Secilia Apps, LElta Guadeloupe10/04/2016, 7:10 AM

## 2016-02-19 NOTE — Lactation Note (Signed)
This note was copied from a baby's chart. Lactation Consultation Note See baby A noted about mom. Baby "B would suck to breast a few times. Suck training w/625ml colostrum baby spit it up.  Patient Name: Katherine Munoz'UToday's Date: 02/19/2016 Reason for consult: Follow-up assessment;Difficult latch   Maternal Data    Feeding Feeding Type: Breast Milk Length of feed: 0 min  LATCH Score/Interventions Latch: Too sleepy or reluctant, no latch achieved, no sucking elicited. Intervention(s): Skin to skin;Teach feeding cues;Waking techniques Intervention(s): Adjust position;Assist with latch;Breast massage;Breast compression  Intervention(s): Hand expression;Skin to skin  Type of Nipple: Everted at rest and after stimulation  Comfort (Breast/Nipple): Filling, red/small blisters or bruises, mild/mod discomfort  Problem noted: Filling Interventions (Filling): Hand pump;Massage  Hold (Positioning): Assistance needed to correctly position infant at breast and maintain latch. Intervention(s): Breastfeeding basics reviewed;Support Pillows;Position options;Skin to skin     Lactation Tools Discussed/Used Tools: Shells;Pump Shell Type: Inverted Breast pump type: Manual Pump Review: Setup, frequency, and cleaning;Milk Storage Initiated by:: Peri JeffersonL. Nateisha Moyd RN IBCLC Date initiated:: 02/19/16   Consult Status Consult Status: Follow-up Date: 02/19/16 Follow-up type: In-patient    Charyl DancerCARVER, Isaih Bulger G 02/19/2016, 7:31 AM

## 2016-02-19 NOTE — Lactation Note (Signed)
This note was copied from a baby's chart. Lactation Consultation Note: When I arrived in room both infants in their own crib. Mother states that she just fed them with ebm . She gave each 4ml of ebm.  Referred to Late Preterm instruction sheet about supplement amounts. Suggested that parents offer addition calories using Alementium formula. Parents agreeable to plan. Advised mother to continue to pump breast every 2-3 hours. Explained supply and demand to parents. Mother is very tired. Advised napping.  Mother plans to give infants additional calories using a gloved finger and a curved tip syringe.   Patient Name: Katherine MunozXToday's Date: 02/19/2016 Reason for consult: Follow-up assessment   Maternal Data    Feeding Feeding Type: Breast Milk  LATCH Score/Interventions                      Lactation Tools Discussed/Used     Consult Status      Katherine Munoz, Katherine Munoz 02/19/2016, 3:00 PM

## 2016-02-19 NOTE — Anesthesia Postprocedure Evaluation (Signed)
Anesthesia Post Note  Patient: Katherine Munoz  Procedure(s) Performed: * No procedures listed *  Patient location during evaluation: Mother Baby Anesthesia Type: Epidural Level of consciousness: awake and alert Pain management: pain level controlled Vital Signs Assessment: post-procedure vital signs reviewed and stable Respiratory status: spontaneous breathing, nonlabored ventilation and respiratory function stable Cardiovascular status: stable Postop Assessment: no headache, no backache and epidural receding Anesthetic complications: no     Last Vitals:  Vitals:   02/19/16 0002 02/19/16 0530  BP: 118/79 124/79  Pulse: 96 95  Resp: 18 18  Temp: 37 C 36.7 C    Last Pain:  Vitals:   02/19/16 0530  TempSrc: Oral  PainSc: 5    Pain Goal: Patients Stated Pain Goal: 2 (02/18/16 2157)               Katherine SilkGILBERT,Jonan Seufert

## 2016-02-20 ENCOUNTER — Ambulatory Visit: Payer: Self-pay

## 2016-02-20 MED ORDER — DOCUSATE SODIUM 100 MG PO CAPS: 100.0000 mg | ORAL_CAPSULE | Freq: Two times a day (BID) | ORAL | 0 refills | Status: DC

## 2016-02-20 MED ORDER — FERROUS SULFATE 325 (65 FE) MG PO TABS: 325.0000 mg | ORAL_TABLET | Freq: Every day | ORAL | 0 refills | Status: DC

## 2016-02-20 MED ORDER — IBUPROFEN 600 MG PO TABS: 600.0000 mg | ORAL_TABLET | Freq: Four times a day (QID) | ORAL | 0 refills | Status: DC

## 2016-02-20 NOTE — Lactation Note (Signed)
This note was copied from a baby's chart. Lactation Consultation Note Mom stated hasn't latched on to breast, nutrition from finger feeding formula, d/t her colostrum has decreased significantly. LC hand expressed w/thick drops of colostrum. Yesterday, mom's breast were dripping at intervals. Mom is wearing supportive bra. Hasn't wore shells. Encouraged to wear shells in bra d/t baby's can't latch well d/t can't obtain a deep latch. Fitted mom w/#20 NS. Latched baby well in football hold. Baby BF well. After finished, no colostrum noted in NS. Discussed w/mom supplementing. W/curve tip syring added into NS Alimentum. Baby BF well. Encouraged mom to use DEBP for stimulation to induce lactation and increase milk supply. Encouraged mom to increase supplementing.  Will discuss w/mom SNS.  Patient Name: Karle BarrBoyB Kaydee Helms CZYSA'YToday's Date: 02/20/2016 Reason for consult: Follow-up assessment;Difficult latch;Infant < 6lbs;Multiple gestation   Maternal Data    Feeding Feeding Type: Breast Fed Length of feed: 20 min (still BF)  LATCH Score/Interventions Latch: Repeated attempts needed to sustain latch, nipple held in mouth throughout feeding, stimulation needed to elicit sucking reflex. Intervention(s): Skin to skin;Teach feeding cues;Waking techniques Intervention(s): Assist with latch;Breast massage;Breast compression  Audible Swallowing: None Intervention(s): Skin to skin;Hand expression Intervention(s): Alternate breast massage  Type of Nipple: Everted at rest and after stimulation  Comfort (Breast/Nipple): Filling, red/small blisters or bruises, mild/mod discomfort  Problem noted: Mild/Moderate discomfort Interventions (Filling): Double electric pump;Massage;Firm support Interventions (Mild/moderate discomfort): Hand massage;Hand expression;Post-pump;Breast shields  Hold (Positioning): Assistance needed to correctly position infant at breast and maintain latch. Intervention(s):  Breastfeeding basics reviewed;Support Pillows;Position options;Skin to skin  LATCH Score: 5  Lactation Tools Discussed/Used Tools: Shells;Nipple Dorris CarnesShields;Pump Nipple shield size: 20 Shell Type: Inverted Breast pump type: Double-Electric Breast Pump   Consult Status Consult Status: Follow-up Date: 02/20/16 Follow-up type: In-patient    Charyl DancerCARVER, Latana Colin G 02/20/2016, 6:36 AM

## 2016-02-20 NOTE — Lactation Note (Signed)
This note was copied from a baby's chart. Lactation Consultation Note Baby not active at breast during the night. Mom states she is putting him to the breast, will suck a couple of times then stop. Mom is mainly finger feeding w/curve tip syring Alimentum 10-15 ml. Encouraged mom to increase feeding to 20ml. Cont. To stimulate baby's to BF first before finger feeding. Post-pump after BF.  Patient Name: Katherine Gloriann LoanCaitlin Helms ZOXWR'UToday's Date: 02/20/2016 Reason for consult: Follow-up assessment;Infant weight loss;Infant < 6lbs;Multiple gestation   Maternal Data    Feeding    LATCH Score/Interventions                      Lactation Tools Discussed/Used     Consult Status Consult Status: Follow-up Date: 02/20/16 Follow-up type: In-patient    Charyl DancerCARVER, Juanita Streight G 02/20/2016, 6:26 AM

## 2016-02-20 NOTE — Discharge Instructions (Signed)
Vaginal Delivery, Care After °Refer to this sheet in the next few weeks. These discharge instructions provide you with information on caring for yourself after delivery. Your health care provider may also give you specific instructions. Your treatment has been planned according to the most current medical practices available, but problems sometimes occur. Call your health care provider if you have any problems or questions after you go home. °HOME CARE INSTRUCTIONS °· Take over-the-counter or prescription medicines only as directed by your health care provider or pharmacist. °· Do not drink alcohol, especially if you are breastfeeding or taking medicine to relieve pain. °· Do not chew or smoke tobacco. °· Do not use illegal drugs. °· Continue to use good perineal care. Good perineal care includes: °¨ Wiping your perineum from front to back. °¨ Keeping your perineum clean. °· Do not use tampons or douche until your health care provider says it is okay. °· Shower, wash your hair, and take tub baths as directed by your health care provider. °· Wear a well-fitting bra that provides breast support. °· Eat healthy foods. °· Drink enough fluids to keep your urine clear or pale yellow. °· Eat high-fiber foods such as whole grain cereals and breads, brown rice, beans, and fresh fruits and vegetables every day. These foods may help prevent or relieve constipation. °· Follow your health care provider's recommendations regarding resumption of activities such as climbing stairs, driving, lifting, exercising, or traveling. °· Talk to your health care provider about resuming sexual activities. Resumption of sexual activities is dependent upon your risk of infection, your rate of healing, and your comfort and desire to resume sexual activity. °· Try to have someone help you with your household activities and your newborn for at least a few days after you leave the hospital. °· Rest as much as possible. Try to rest or take a nap  when your newborn is sleeping. °· Increase your activities gradually. °· Keep all of your scheduled postpartum appointments. It is very important to keep your scheduled follow-up appointments. At these appointments, your health care provider will be checking to make sure that you are healing physically and emotionally. °SEEK MEDICAL CARE IF:  °· You are passing large clots from your vagina. Save any clots to show your health care provider. °· You have a foul smelling discharge from your vagina. °· You have trouble urinating. °· You are urinating frequently. °· You have pain when you urinate. °· You have a change in your bowel movements. °· You have increasing redness, pain, or swelling near your vaginal incision (episiotomy) or vaginal tear. °· You have pus draining from your episiotomy or vaginal tear. °· Your episiotomy or vaginal tear is separating. °· You have painful, hard, or reddened breasts. °· You have a severe headache. °· You have blurred vision or see spots. °· You feel sad or depressed. °· You have thoughts of hurting yourself or your newborn. °· You have questions about your care, the care of your newborn, or medicines. °· You are dizzy or light-headed. °· You have a rash. °· You have nausea or vomiting. °· You were breastfeeding and have not had a menstrual period within 12 weeks after you stopped breastfeeding. °· You are not breastfeeding and have not had a menstrual period by the 12th week after delivery. °· You have a fever. °SEEK IMMEDIATE MEDICAL CARE IF:  °· You have persistent pain. °· You have chest pain. °· You have shortness of breath. °· You faint. °· You   have leg pain.  You have stomach pain.  Your vaginal bleeding saturates two or more sanitary pads in 1 hour.   This information is not intended to replace advice given to you by your health care provider. Make sure you discuss any questions you have with your health care provider.   Document Released: 04/23/2000 Document Revised:  01/15/2015 Document Reviewed: 12/22/2011 Elsevier Interactive Patient Education 2016 [employer].   Breastfeeding Challenges and Solutions Even though breastfeeding is natural, it can be challenging, especially in the first few weeks after childbirth. It is normal for problems to arise when starting to breastfeed your new baby, even if you have breastfed before. This document provides some solutions to the most common breastfeeding challenges.  CHALLENGES AND SOLUTIONS Challenge--Cracked or Sore Nipples Cracked or sore nipples are commonly experienced by breastfeeding mothers. Cracked or sore nipples often are caused by inadequate latching (when your baby's mouth attaches to your breast to breastfeed). Soreness can also happen if your baby is not positioned properly at your breast. Although nipple cracking and soreness are common during the first week after birth, nipple pain is never normal. If you experience nipple cracking or soreness that lasts longer than 1 week or nipple pain, call your health care provider or lactation consultant.  Solution Ensure proper latching and positioning of your baby by following the steps below:  Find a comfortable place to sit or lie down, with your neck and back well supported.  Place a pillow or rolled up blanket under your baby to bring him or her to the level of your breast (if you are seated).  Make sure that your baby's abdomen is facing your abdomen.  Gently massage your breast. With your fingertips, massage from your chest wall toward your nipple in a circular motion. This encourages milk flow. You may need to continue this action during the feeding if your milk flows slowly.  Support your breast with 4 fingers underneath and your thumb above your nipple. Make sure your fingers are well away from your nipple and your baby's mouth.  Stroke your baby's lips gently with your finger or nipple.  When your baby's mouth is open wide enough, quickly bring  your baby to your breast, placing your entire nipple and as much of the colored area around your nipple (areola) as possible into your baby's mouth.  More areola should be visible above your baby's upper lip than below the lower lip.  Your baby's tongue should be between his or her lower gum and your breast.  Ensure that your baby's mouth is correctly positioned around your nipple (latched). Your baby's lips should create a seal on your breast and be turned out (everted).  It is common for your baby to suck for about 2-3 minutes in order to start the flow of breast milk. Signs that your baby has successfully latched on to your nipple include:   Quietly tugging or quietly sucking without causing you pain.   Swallowing heard between every 3-4 sucks.   Muscle movement above and in front of his or her ears with sucking.  Signs that your baby has not successfully latched on to nipple include:   Sucking sounds or smacking sounds from your baby while nursing.   Nipple pain.  Ensure that your breasts stay moisturized and healthy by:  Avoiding the use of soap on your nipples.   Wearing a supportive bra. Avoid wearing underwire-style bras or tight bras.   Air drying your nipples for 3-4  minutes after each feeding.   Using only cotton bra pads to absorb breast milk leakage. Leaking of breast milk between feedings is normal. Be sure to change the pads if they become soaked with milk.  Using lanolin on your nipples after nursing. Lanolin helps to maintain your skin's normal moisture barrier. If you use pure lanolin you do not need to wash it off before feeding your baby again. Pure lanolin is not toxic to your baby. You may also hand express a few drops of breast milk and gently massage that milk into your nipples, allowing it to air dry. Challenge--Breast Engorgement Breast engorgement is the overfilling of your breasts with breast milk. In the first few weeks after giving birth, you  may experience breast engorgement. Breast engorgement can make your breasts throb and feel hard, tightly stretched, warm, and tender. Engorgement peaks about the fifth day after you give birth. Having breast engorgement does not mean you have to stop breastfeeding your baby. Solution  Breastfeed when you feel the need to reduce the fullness of your breasts or when your baby shows signs of hunger. This is called "breastfeeding on demand."  Newborns (babies younger than 4 weeks) often breastfeed every 1-3 hours during the day. You may need to awaken your baby to feed if he or she is asleep at a feeding time.  Do not allow your baby to sleep longer than 5 hours during the night without a feeding.  Pump or hand express breast milk before breastfeeding to soften your breast, areola, and nipple.  Apply warm, moist heat (in the shower or with warm water-soaked hand towels) just before feeding or pumping, or massage your breast before or during breastfeeding. This increases circulation and helps your milk to flow.  Completely empty your breasts when breastfeeding or pumping. Afterward, wear a snug bra (nursing or regular) or tank top for 1-2 days to signal your body to slightly decrease milk production. Only wear snug bras or tank tops to treat engorgement. Tight bras typically should be avoided by breastfeeding mothers. Once engorgement is relieved, return to wearing regular, loose-fitting clothes.  Apply ice packs to your breasts to lessen the pain from engorgement and relieve swelling, unless the ice is uncomfortable for you.  Do not delay feedings. Try to relax when it is time to feed your baby. This helps to trigger your "let-down reflex," which releases milk from your breast.  Ensure your baby is latched on to your breast and positioned properly while breastfeeding.  Allow your baby to remain at your breast as long as he or she is latched on well and actively sucking. Your baby will let you know  when he or she is done breastfeeding by pulling away from your breast or falling asleep.  Avoid introducing bottles or pacifiers to your baby in the early weeks of breastfeeding. Wait to introduce these things until after resolving any breastfeeding challenges.  Try to pump your milk on the same schedule as when your baby would breastfeed if you are returning to work or away from home for an extended period.  Drink plenty of fluids to avoid dehydration, which can eventually put you at greater risk of breast engorgement. If you follow these suggestions, your engorgement should improve in 24-48 hours. If you are still experiencing difficulty, call your lactation consultant or health care provider.  Challenge--Plugged Milk Ducts Plugged milk ducts occur when the duct does not drain milk effectively and becomes swollen. Wearing a tight-fitting nursing bra or  having difficulty with latching may cause plugged milk ducts. Not drinking enough water (8-10 c [1.9-2.4 L] per day) can contribute to plugged milk ducts. Once a duct has become plugged, hard lumps, soreness, and redness may develop in your breast.  Solution Do not delay feedings. Feed your baby frequently and try to empty your breasts of milk at each feeding. Try breastfeeding from the affected side first so there is a better chance that the milk will drain completely from that breast. Apply warm, moist towels to your breasts for 5-10 minutes before feeding. Alternatively, a hot shower right before breastfeeding can provide the moist heat that can encourage milk flow. Gentle massage of the sore area before and during a feeding may also help. Avoid wearing tight clothing or bras that put pressure on your breasts. Wear bras that offer good support to your breasts, but avoid underwire bras. If you have a plugged milk duct and develop a fever, you need to see your health care provider.  Challenge--Mastitis Mastitis is inflammation of your breast. It  usually is caused by a bacterial infection and can cause flu-like symptoms. You may develop redness in your breast and a fever. Often when mastitis occurs, your breast becomes firm, warm, and very painful. The most common causes of mastitis are poor latching, ineffective sucking from your baby, consistent pressure on your breast (possibly from wearing a tight-fitting bra or shirt that restricts the milk flow), unusual stress or fatigue, or missed feedings.  Solution You will be given antibiotic medicine to treat the infection. It is still important to breastfeed frequently to empty your breasts. Continuing to breastfeed while you recover from mastitis will not harm your baby. Make sure your baby is positioned properly during every feeding. Apply moist heat to your breasts for a few minutes before feeding to help the milk flow and to help your breasts empty more easily. Challenge--Thrush Ginette Pitmanhrush is a yeast infection that can form on your nipples, in your breast, or in your baby's mouth. It causes itching, soreness, burning or stabbing pain, and sometimes a rash.  Solution You will be given a medicated ointment for your nipples, and your baby will be given a liquid medicine for his or her mouth. It is important that you and your baby are treated at the same time because thrush can be passed between you and your baby. Change disposable nursing pads often. Any bras, towels, or clothing that come in contact with infected areas of your body or your baby's body need to be washed in very hot water every day. Wash your hands and your baby's hands often. All pacifiers, bottle nipples, or toys your baby puts in his or her mouth should be boiled once a day for 20 minutes. After 1 week of treatment, discard pacifiers and bottle nipples and buy new ones. All breast pump parts that touch the milk need to be boiled for 20 minutes every day. Challenge--Low Milk Supply You may not be producing enough milk if your baby is not  gaining the proper amount of weight. Breast milk production is based on a supply-and-demand system. Your milk supply depends on how frequently and effectively your baby empties your breast. Solution The more you breastfeed and pump, the more breast milk you will produce. It is important that your baby empties at least one of your breasts at each feeding. If this is not happening, then use a breast pump or hand express any milk that remains. This will help to drain  as much milk as possible at each feeding. It will also signal your body to produce more milk. If your baby is not emptying your breasts, it may be due to latching, sucking, or positioning problems. If low milk supply continues after addressing these issues, contact your health care provider or a lactation specialist as soon as possible. Challenge--Inverted or Flat Nipples Some women have nipples that turn inward instead of protruding outward. Other women have nipples that are flat. Inverted or flat nipples can sometimes make it more difficult for your baby to latch onto your breast. Solution You may be given a small device that pulls out inverted nipples. This device should be applied right before your baby is brought to your breast. You can also try using a breast pump for a short time before placing the baby at your breast. The pump can pull your nipple outwards to help your infant latch more easily. The baby's sucking motion will help the inverted nipple protrude as well.  If you have flat nipples, encourage your baby to latch onto your breast and feed frequently in the early days after birth. This will give your baby practice latching on correctly while your breast is still soft. When your milk supply increases, between the second and fifth day after birth and your breasts become full, your baby will have an easier time latching.  Contact a lactation consultant if you still have concerns. She or he can teach you additional techniques to  address breastfeeding problems related to nipple shape and position.  FOR MORE INFORMATION [employer]: www.llli.org   This information is not intended to replace advice given to you by your health care provider. Make sure you discuss any questions you have with your health care provider.   Document Released: 10/18/2005 Document Revised: 05/17/2014 Document Reviewed: 10/20/2012 Elsevier Interactive Patient Education [employer]  Contraception Choices Contraception (birth control) is the use of any methods or devices to prevent pregnancy. Below are some methods to help avoid pregnancy. HORMONAL METHODS   Contraceptive implant. This is a thin, plastic tube containing progesterone hormone. It does not contain estrogen hormone. Your health care provider inserts the tube in the inner part of the upper arm. The tube can remain in place for up to 3 years. After 3 years, the implant must be removed. The implant prevents the ovaries from releasing an egg (ovulation), thickens the cervical mucus to prevent sperm from entering the uterus, and thins the lining of the inside of the uterus.  Progesterone-only injections. These injections are given every 3 months by your health care provider to prevent pregnancy. This synthetic progesterone hormone stops the ovaries from releasing eggs. It also thickens cervical mucus and changes the uterine lining. This makes it harder for sperm to survive in the uterus.  Birth control pills. These pills contain estrogen and progesterone hormone. They work by preventing the ovaries from releasing eggs (ovulation). They also cause the cervical mucus to thicken, preventing the sperm from entering the uterus. Birth control pills are prescribed by a health care provider.Birth control pills can also be used to treat heavy periods.  Minipill. This type of birth control pill contains only the progesterone hormone. They are taken every day of each month and  must be prescribed by your health care provider.  Birth control patch. The patch contains hormones similar to those in birth control pills. It must be changed once a week and is prescribed by a health care provider.  Vaginal  ring. The ring contains hormones similar to those in birth control pills. It is left in the vagina for 3 weeks, removed for 1 week, and then a new one is put back in place. The patient must be comfortable inserting and removing the ring from the vagina.A health care provider's prescription is necessary.  Emergency contraception. Emergency contraceptives prevent pregnancy after unprotected sexual intercourse. This pill can be taken right after sex or up to 5 days after unprotected sex. It is most effective the sooner you take the pills after having sexual intercourse. Most emergency contraceptive pills are available without a prescription. Check with your pharmacist. Do not use emergency contraception as your only form of birth control. BARRIER METHODS   Female condom. This is a thin sheath (latex or rubber) that is worn over the penis during sexual intercourse. It can be used with spermicide to increase effectiveness.  Female condom. This is a soft, loose-fitting sheath that is put into the vagina before sexual intercourse.  Diaphragm. This is a soft, latex, dome-shaped barrier that must be fitted by a health care provider. It is inserted into the vagina, along with a spermicidal jelly. It is inserted before intercourse. The diaphragm should be left in the vagina for 6 to 8 hours after intercourse.  Cervical cap. This is a round, soft, latex or plastic cup that fits over the cervix and must be fitted by a health care provider. The cap can be left in place for up to 48 hours after intercourse.  Sponge. This is a soft, circular piece of polyurethane foam. The sponge has spermicide in it. It is inserted into the vagina after wetting it and before sexual intercourse.  Spermicides.  These are chemicals that kill or block sperm from entering the cervix and uterus. They come in the form of creams, jellies, suppositories, foam, or tablets. They do not require a prescription. They are inserted into the vagina with an applicator before having sexual intercourse. The process must be repeated every time you have sexual intercourse. INTRAUTERINE CONTRACEPTION  Intrauterine device (IUD). This is a T-shaped device that is put in a woman's uterus during a menstrual period to prevent pregnancy. There are 2 types:  Copper IUD. This type of IUD is wrapped in copper wire and is placed inside the uterus. Copper makes the uterus and fallopian tubes produce a fluid that kills sperm. It can stay in place for 10 years.  Hormone IUD. This type of IUD contains the hormone progestin (synthetic progesterone). The hormone thickens the cervical mucus and prevents sperm from entering the uterus, and it also thins the uterine lining to prevent implantation of a fertilized egg. The hormone can weaken or kill the sperm that get into the uterus. It can stay in place for 3-5 years, depending on which type of IUD is used. PERMANENT METHODS OF CONTRACEPTION  Female tubal ligation. This is when the woman's fallopian tubes are surgically sealed, tied, or blocked to prevent the egg from traveling to the uterus.  Hysteroscopic sterilization. This involves placing a small coil or insert into each fallopian tube. Your doctor uses a technique called hysteroscopy to do the procedure. The device causes scar tissue to form. This results in permanent blockage of the fallopian tubes, so the sperm cannot fertilize the egg. It takes about 3 months after the procedure for the tubes to become blocked. You must use another form of birth control for these 3 months.  Female sterilization. This is when the female has the  tubes that carry sperm tied off (vasectomy).This blocks sperm from entering the vagina during sexual intercourse.  After the procedure, the man can still ejaculate fluid (semen). NATURAL PLANNING METHODS  Natural family planning. This is not having sexual intercourse or using a barrier method (condom, diaphragm, cervical cap) on days the woman could become pregnant.  Calendar method. This is keeping track of the length of each menstrual cycle and identifying when you are fertile.  Ovulation method. This is avoiding sexual intercourse during ovulation.  Symptothermal method. This is avoiding sexual intercourse during ovulation, using a thermometer and ovulation symptoms.  Post-ovulation method. This is timing sexual intercourse after you have ovulated. Regardless of which type or method of contraception you choose, it is important that you use condoms to protect against the transmission of sexually transmitted infections (STIs). Talk with your health care provider about which form of contraception is most appropriate for you.   This information is not intended to replace advice given to you by your health care provider. Make sure you discuss any questions you have with your health care provider.   Document Released: 04/26/2005 Document Revised: 05/01/2013 Document Reviewed: 10/19/2012 Elsevier Interactive Patient Education [employer]

## 2016-02-20 NOTE — Discharge Summary (Signed)
OB Discharge Summary     Patient Name: Gloriann LoanCaitlin Helms DOB: 1996/02/11 MRN: [id number]  Date of admission: 02/18/2016 Delivering MD:    Roxanne MinsHelms, Boy Xochilt [[id number]][[id number]]  [facility], [address]ICHOL[address] MICHAEL   Piedad ClimesHelms, BoyB Charlotteaitlin [[id number]][[id number]]  Ernestina PennaSCHENK, NICHOLAS MICHAEL   Date of discharge: 02/20/2016  Admitting diagnosis: INDUCTION Intrauterine pregnancy: 1826w1d     Secondary diagnosis:  Principal Problem:   NSVD (normal spontaneous vaginal delivery) Active Problems:   Supervision of high-risk pregnancy   GDM (gestational diabetes mellitus)   Dichorionic diamniotic twin gestation   GBS (group B streptococcus) UTI complicating pregnancy   twin b with enlarged. 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 yo G2P1011 at 4726w1d was admitted to the hospital 02/18/2016 for induction of labor.  Indication for induction: A1 DM and Cholestasis of pregnancy.  Patient had an uncomplicated labor course as follows: Membrane Rupture Time/Date:    Roxanne MinsHelms, Boy Vanessa [[id number]][[id number]]  7:23 AM   Karle BarrHelms, BoyB Imogene [[id number]][[id number]]  3:52 PM ,   Roxanne MinsHelms, Boy Shalane [[id number]][[id number]]  02/18/2016   Karle BarrHelms, BoyB Capri [[id number]][[id number]]  02/18/2016   Intrapartum Procedures: Episiotomy:    Roxanne MinsHelms, Boy Jeralynn [[id number]][[id number]]  None [1]   Karle BarrHelms, BoyB Vayla [[id number]][[id number]]  None [1]                                         Lacerations:     Roxanne MinsHelms, Boy Bari [[id number]][[id number]]  Labial [10]   Karle BarrHelms, BoyB Fatou [[id number]][[id number]]  Labial [10]  Patient had delivery of a Viable infant.   Information for the patient's newborn:  Roxanne MinsHelms, Boy Marifer [[id number]][[id number]]  Delivery Method: Vaginal, Spontaneous Delivery (Filed from Delivery Summary) Information for the patient's newborn:  Karle BarrHelms, BoyB Rosa [[id number]][[id number]]  Delivery Method: Vaginal, Spontaneous Delivery (Filed from Delivery Summary)     Roxanne MinsHelms, Boy Lainee [[id number]][[id number]]  02/18/2016   Karle BarrHelms, BoyB Devetta [[id number]][[id number]]  02/18/2016  Details of delivery can be found in separate delivery note.  Patient had a routine postpartum course. Patient is discharged home 02/20/16.   Physical exam Vitals:   02/19/16 0002 02/19/16 0530 02/19/16 1824 02/20/16 0533  BP: 118/79 124/79 122/77 (!) 116/58  Pulse: 96 95 86 67  Resp: 18 18 18 18   Temp: 98.6 F (37 C) 98 F (36.7 C) 98 F (36.7 C) 97.9 F (36.6 C)  TempSrc: Oral Oral    SpO2:      Weight:      Height:       General: alert, cooperative  and no distress Lochia: appropriate Uterine Fundus: firm Incision: N/A DVT Evaluation: Negative Homan's sign. Labs: Lab Results  Component Value Date   WBC 11.0 (H) 02/18/2016   HGB 9.5 (L) 02/18/2016   HCT 29.8 (L) 02/18/2016   MCV 74.5 (L) 02/18/2016   PLT 230 02/18/2016   CMP Latest Ref Rng & Units 02/06/2016  Glucose 65 - 99 mg/dL 89  BUN 6 - 20 mg/dL 16  Creatinine 1.61 - 0.96 mg/dL 0.45  Sodium 409 - 811 mmol/L 135  Potassium 3.5 - 5.1 mmol/L 4.4  Chloride 101 - 111 mmol/L 105  CO2 22 - 32 mmol/L 24  Calcium 8.9 - 10.3 mg/dL 9.5  Total Protein 6.5 - 8.1 g/dL 6.6  Total Bilirubin 0.3 - 1.2 mg/dL 0.3  Alkaline Phos 38 - 126 U/L 207(H)  AST 15 - 41 U/L 23  ALT 14 - 54 U/L 9(L)    Discharge instruction: per After Visit Summary and "Baby and Me Booklet".  After visit meds:    Medication List    STOP taking these medications   ursodiol 300 MG capsule Commonly known as:  ACTIGALL     TAKE these medications   docusate sodium 100 MG capsule Commonly known as:  COLACE Take 1 capsule (100 mg total) by mouth 2 (two)  times daily.   ferrous sulfate 325 (65 FE) MG tablet Commonly known as:  FERROUSUL Take 1 tablet (325 mg total) by mouth daily.   hydrOXYzine 25 MG tablet Commonly known as:  ATARAX/VISTARIL Take 1 tablet (25 mg total) by mouth every 6 (six) hours.   ibuprofen 600 MG tablet Commonly known as:  ADVIL,MOTRIN Take 1 tablet (600 mg total) by mouth every 6 (six) hours.   prenatal multivitamin Tabs tablet Take 1 tablet by mouth daily at 12 noon.   triamcinolone ointment 0.5 % Commonly known as:  KENALOG Apply 1 application topically 2 (two) times daily.       Diet: Iron-rich diet  Activity: Advance as tolerated. Pelvic rest for 6 weeks.   Outpatient follow up:6 weeks Follow up Appt:Future Appointments Date Time Provider Department Center  03/16/2016 2:40 PM Aviva Signs, CNM [facility]   Follow up Visit: Follow-up Information    [facility] .   Specialty:  Obstetrics and Gynecology Contact information: [address] [place] [address] [phone]       THE [facility] OF [facility] MATERNITY ADMISSIONS .   Why:  as needed in emergencies Contact information: [address] [phone] mc [place] [address] [phone]          Postpartum contraception: Abstinence  Newborn Data:   Kelita, Wallis [[id number]]  Live born female  Birth Weight: 6 lb 2.9 oz (2805 g) APGAR: 8, 9   Sunnie, Odden [[id number]]  Live born female  Birth Weight: 5 lb 14.2 oz (2670 g) APGAR: 1, 9  Baby Feeding: Breast Disposition:rooming in   02/20/2016 Dorathy Kinsman, CNM

## 2016-02-20 NOTE — Lactation Note (Signed)
This note was copied from a baby's chart. Lactation Consultation Note  Patient Name: Boy Rosezella Kronick ZOXWR'U Date: 02/20/2016 Reason for consult: Follow-up assessment;Other (Comment);Multiple gestation;Infant < 6lbs (6% weight loss, early term infant )  Baby A is 45 plus hours old and has been consistent with feedings, and supplementing by finger feeding.  Mom had just recently breast fed 10 mins, and supplemented with Alimentum by finger feeding, baby tolerated well.  LC did have to remind mom to prop  baby up in semi fowlers to prevent choking while finger feeding.  @ the end of the consult discussed with mom and dad the recommendation for the baby to be supplemented with a slow flow nipple  So the baby will start opening wider to accommodate the Larger NS  #24 at the breast with an appetizer.  Also to work on increasing the volume of supplementing following the LPT policy due to the 6% weight loss, < 6 pounds,  Discussed with parents nutritive sucking patterns vs non - nutritive feeding patterns and not allowing the baby to hang out at the breast.   per mom the recent was feeding 10 mins at the breast and supplementing 25 ml .   Baby B -  Due to eat at consult - LC checked diaper , mec smear changed and baby placed skin to skin , football position. Attempted to latch 1st without the NS and baby having a difficult time sustaining a deep latch and pushing off with his tongue. Applied the #20 NS and noted the size to be so snug . LC re - sized with #24 NS and it accommodated the moms nipple better.  Per mom with the last feeding for baby A the #20 NS felt snug.  After application , instilled the Formula into the top, baby licked the NS for a few times , and LC challenged the baby to open wide.  Baby eventually accommodated the #24 NS and depth achieved and fed for 10 mins in a consistent pattern, ( football )  After breast feeding showed mom and dad how to pace feed from a bottle , and flip  upper lip to flanged position and easy chin. Goal is to get both babies to open wider at the breast with a NS for now and with the bottle.  LC set up DEBP for mom to post pump both breast , and per mom the #24 Flange comfortable.   Explained to mom and dad due to working on the twins to open wide it will be beneficial with pace feeding.  Reviewed cleaning pump pieces.  Mom and dad receptive to teaching during the consult. And for the [facility] plan below>   LC plan of care for both twins, STS feedings, Check diaper and change is needed.  If wide awake and rooting - try latching with a filled NS and feed for 20 mins max,  then supplement with a bottle - working on increasing volume to at least 30 ml today ( pace feeding)  Then after the Twins feed , post pump both breast 15 -20 mins , save milk for the next feed.  ( LC reminded mom to center pump pieces and then not to watch them while pumping.    Maternal Data    Feeding Feeding Type: Breast Fed  LATCH Score/Interventions                Intervention(s): Breastfeeding basics reviewed     Lactation Tools Discussed/Used Tools: Shells;Nipple Dorris Carnes;Pump Nipple  shield size: 20 (per mom used #20 NS - felt tight ) Shell Type: Inverted Breast pump type: Double-Electric Breast Pump (per mom #24 Flange feels comfortable )   Consult Status Consult Status: Follow-up Date: 02/21/16 Follow-up type: In-patient    Matilde SprangMargaret Ann Uchenna Rappaport 02/20/2016, 3:47 PM

## 2016-02-21 ENCOUNTER — Ambulatory Visit: Payer: Self-pay

## 2016-02-21 NOTE — Lactation Note (Signed)
This note was copied from a baby's chart. Lactation Consultation Note  Patient Name: Katherine Munoz ZOXWR'UToday's Date: 02/21/2016 Reason for consult: Follow-up assessment;Multiple gestation;Infant < 6lbs;Other (Comment) (early term) Baby B - Mom attempting to latch baby using 24 nipple shield at this visit but baby not obtaining good depth, baby sleepy. LC noted the 24 nipple shield appears large for Mom today. Mom's nipple erect so assisted Mom with using breast compression and latching baby without the nipple shield. After few attempts baby was able to latch and sustain the latch. Few good suckling bursts off/on with stimulation. Re-applied the 20 nipple shield to see if this kept baby more stimulated at breast. Did not make much difference at this visit, but did observe colostrum in nipple shield after the feeding. Mom did not report any discomfort. FOB giving supplement of Neosure, advised babies should have 30+ ml each feeding,  Peds changed formula to 22 cal. Baby has had good output but needs more time at breast. Per chart review, baby has been to breast 3 times in past 24 hours but Mom reports she puts babies to breast with each feeding.   Baby A - Assisted Mom with latching Baby A without the nipple shield, baby very sleepy at breast, few good suckling bursts with stimulation. Applied 20 nipple shield, worked with Mom on sustaining good depth. Colostrum present in the nipple shield at the end of the feeding. Mom giving supplement of Neosure after the feeding. Adequate output the past 24 hours. BF X per chart review but Mom reports putting babies to breast with each feeding.   Plan for d/c: Outpatient f/u scheduled for Wednesday, 02/25/16 at 2:30 Encouraged Mom to keep BF with feeding ques but at least every 3 hours. Try to latch without the nipple shield but if babies not able to sustain depth or will not stay awake, use 20 nipple shield for stimulation. Be sure to look for colostrum in nipple  shield with feedings. Continue to supplement with each feeding 30+ ml. Mom to BF, let FOB supplement. Advised to change to Dr. Manson PasseyBrown #1 bottles for a more paced feeding. Mom to post pump every 3 hours for 15-20 minutes to encourage milk production and to have EBM to supplement. Mom has DEBP at home. Engorgement care reviewed.  Once her milk has come in, advised Mom to pre-pump to soften nipple/aerola for latch and to remove lower fat milk so babies will get higher fat milk at breast.  Mom to call for questions or concerns. Advised to call if she would like help with latch before d/c home.    Maternal Data    Feeding Feeding Type: Formula Length of feed: 10 min  LATCH Score/Interventions Latch: Repeated attempts needed to sustain latch, nipple held in mouth throughout feeding, stimulation needed to elicit sucking reflex. Intervention(s): Adjust position;Assist with latch;Breast massage;Breast compression  Audible Swallowing: A few with stimulation  Type of Nipple: Everted at rest and after stimulation (short nipple shafts bilateral, right more than left)  Comfort (Breast/Nipple): Soft / non-tender     Hold (Positioning): Assistance needed to correctly position infant at breast and maintain latch.  LATCH Score: 7  Lactation Tools Discussed/Used Tools: Nipple Dorris CarnesShields;Shells;Pump Nipple shield size: 20;24 Shell Type: Inverted Breast pump type: Double-Electric Breast Pump   Consult Status Consult Status: Complete Date: 02/21/16 Follow-up type: In-patient    Katherine Munoz, Katherine Munoz 02/21/2016, 10:33 AM

## 2016-02-25 ENCOUNTER — Ambulatory Visit (HOSPITAL_COMMUNITY): Payer: Medicaid Other

## 2016-03-16 ENCOUNTER — Ambulatory Visit: Payer: Medicaid Other | Admitting: Advanced Practice Midwife

## 2016-03-19 ENCOUNTER — Other Ambulatory Visit: Payer: Self-pay | Admitting: Advanced Practice Midwife

## 2016-03-31 ENCOUNTER — Encounter: Payer: Self-pay | Admitting: Obstetrics and Gynecology

## 2016-03-31 ENCOUNTER — Ambulatory Visit (INDEPENDENT_AMBULATORY_CARE_PROVIDER_SITE_OTHER): Payer: Medicaid Other | Admitting: Obstetrics and Gynecology

## 2016-03-31 NOTE — Progress Notes (Signed)
Subjective:     Katherine LoanCaitlin Munoz is a 20 y.o. female who presents for a postpartum visit. She is 6 weeks postpartum following a spontaneous vaginal delivery. I have fully reviewed the prenatal and intrapartum course. The delivery was at 37 gestational weeks. Outcome: spontaneous vaginal delivery. Anesthesia: epidural. Postpartum course has been uneventful. She does have some minimal complaints of pain near the sight of her labial tear. She denies any vaginal bleeding. She does have some soreness with whipping. Both Babies's course has been uneventgul. Baby is feeding by breast. Bleeding no bleeding. Bowel function is normal. Bladder function is normal. Patient is not sexually active. Contraception method is none. Postpartum depression screening: negative.  The following portions of the patient's history were reviewed and updated as appropriate: past family history, past medical history, past social history and past surgical history.  Review of Systems Pertinent items are noted in HPI.   Objective:    There were no vitals taken for this visit.  General:  alert, cooperative and appears stated age   Breasts:  denied in breast feedin mother.  Lungs: clear to auscultation bilaterally  Heart:  regular rate and rhythm, S1, S2 normal, no murmur, click, rub or gallop  Abdomen: soft, non-tender; bowel sounds normal; no masses,  no organomegaly   Vulva:  patietn wtih small granulation tissue and healing tear on the right labia.  Vagina: normal vagina, no discharge, exudate, lesion, or erythema  Cervix:  not examined  Corpus: not examined  Adnexa:  not evaluated  Rectal Exam: Not performed.        Assessment:     Normal postpartum exam. Pap smear not done at today's visit.   Plan:    1. Contraception: abstinence, advised there are many birth controls options safe with breast feeding. 2. Labial laceration, healing well. Advised patient can resume working out.  3. Follow up in: 1 year or as needed.

## 2016-07-24 ENCOUNTER — Encounter (HOSPITAL_BASED_OUTPATIENT_CLINIC_OR_DEPARTMENT_OTHER): Payer: Self-pay | Admitting: *Deleted

## 2016-07-24 ENCOUNTER — Emergency Department (HOSPITAL_BASED_OUTPATIENT_CLINIC_OR_DEPARTMENT_OTHER)
Admission: EM | Admit: 2016-07-24 | Discharge: 2016-07-25 | Disposition: A | Discharge status: Home / Self Care | Payer: Medicaid Other | Attending: Emergency Medicine | Admitting: Emergency Medicine

## 2016-07-24 DIAGNOSIS — R112 Nausea with vomiting, unspecified: Secondary | ICD-10-CM | POA: Insufficient documentation

## 2016-07-24 LAB — COMPREHENSIVE METABOLIC PANEL
ALT: 14 U/L (ref 14–54)
ANION GAP: 7 (ref 5–15)
AST: 19 U/L (ref 15–41)
Albumin: 4.1 g/dL (ref 3.5–5.0)
Alkaline Phosphatase: 89 U/L (ref 38–126)
BILIRUBIN TOTAL: 0.6 mg/dL (ref 0.3–1.2)
BUN: 8 mg/dL (ref 6–20)
CHLORIDE: 105 mmol/L (ref 101–111)
CO2: 26 mmol/L (ref 22–32)
Calcium: 9 mg/dL (ref 8.9–10.3)
Creatinine, Ser: 0.73 mg/dL (ref 0.44–1.00)
Glucose, Bld: 103 mg/dL — ABNORMAL HIGH (ref 65–99)
POTASSIUM: 3.6 mmol/L (ref 3.5–5.1)
Sodium: 138 mmol/L (ref 135–145)
TOTAL PROTEIN: 7.6 g/dL (ref 6.5–8.1)

## 2016-07-24 LAB — CBC
HEMATOCRIT: 38.2 % (ref 36.0–46.0)
Hemoglobin: 12.6 g/dL (ref 12.0–15.0)
MCH: 28.8 pg (ref 26.0–34.0)
MCHC: 33 g/dL (ref 30.0–36.0)
MCV: 87.4 fL (ref 78.0–100.0)
Platelets: 175 10*3/uL (ref 150–400)
RBC: 4.37 MIL/uL (ref 3.87–5.11)
RDW: 14.4 % (ref 11.5–15.5)
WBC: 7.3 10*3/uL (ref 4.0–10.5)

## 2016-07-24 LAB — LIPASE, BLOOD: LIPASE: 14 U/L (ref 11–51)

## 2016-07-24 MED ORDER — SODIUM CHLORIDE 0.9 % IV BOLUS (SEPSIS)
1000.0000 mL | Freq: Once | INTRAVENOUS | Status: AC
  Administered 2016-07-25: 1000 mL via INTRAVENOUS

## 2016-07-24 MED ORDER — ONDANSETRON HCL 4 MG/2ML IJ SOLN
4.0000 mg | Freq: Once | INTRAMUSCULAR | Status: AC
  Administered 2016-07-25: 4 mg via INTRAVENOUS
  Filled 2016-07-24: qty 2

## 2016-07-24 NOTE — ED Triage Notes (Signed)
Pt reports HA, N/V, body aches x 1 day.

## 2016-07-24 NOTE — ED Provider Notes (Signed)
[facility] Provider Note   CSN: [id number] Arrival date & time: 07/24/16  2145  By signing my name below, I, Linna Darner, attest that this documentation has been prepared under the direction and in the presence of physician practitioner, Linwood Dibbles, MD. Electronically Signed: Linna Darner, Scribe. 07/24/2016. 11:40 PM.  History   Chief Complaint Chief Complaint  Patient presents with  . Emesis    The history is provided by the patient. No language interpreter was used.     HPI Comments: Katherine Munoz is a 21 y.o. female who presents to the Emergency Department complaining of persistent nausea and vomiting beginning yesterday. She reports associated generalized weakness and mild fevers (tmax 100). Pt has had occasional episodes of diarrhea in association with her N/V but none today. She notes she had a sore throat two days ago that has resolved. She states she has been unable to tolerate food or fluids. No alleviating factors noted. Pt denies abdominal pain, SOB, cough, difficulty urinating, dysuria, urinary frequency, or any other associated symptoms.  Past Medical History:  Diagnosis Date  . Cholestasis during pregnancy   . Medical history non-contributory   . Scoliosis     There are no active problems to display for this patient.   Past Surgical History:  Procedure Laterality Date  . TONSILLECTOMY      OB History    Gravida Para Term Preterm AB Living   2 1 1  0 1 1   SAB TAB Ectopic Multiple Live Births   1     1 1        Home Medications    Prior to Admission medications   Medication Sig Start Date End Date Taking? Authorizing Provider  ondansetron (ZOFRAN ODT) 8 MG disintegrating tablet Take 1 tablet (8 mg total) by mouth every 8 (eight) hours as needed for nausea or vomiting. 07/25/16   Linwood Dibbles, MD  Prenatal Vit-Fe Fumarate-FA (PRENATAL MULTIVITAMIN) TABS tablet Take 1 tablet by mouth daily at 12 noon.    Historical Provider, MD    Family  History Family History  Problem Relation Age of Onset  . Heart disease Maternal Grandfather     Social History Social History  Substance Use Topics  . Smoking status: Never Smoker  . Smokeless tobacco: Never Used  . Alcohol use No     Allergies   Patient has no known allergies.   Review of Systems Review of Systems  All other systems reviewed and are negative.   Physical Exam Updated Vital Signs BP 112/67 (BP Location: Right Arm)   Pulse 88   Temp 98.5 F (36.9 C) (Oral)   Resp 14   Ht 5\' 1"  (1.549 m)   Wt 57.2 kg   LMP 07/18/2016   SpO2 97%   BMI 23.81 kg/m   Physical Exam  Constitutional: She appears well-developed and well-nourished. No distress.  HENT:  Head: Normocephalic and atraumatic.  Right Ear: External ear normal.  Left Ear: External ear normal.  Eyes: Conjunctivae are normal. Right eye exhibits no discharge. Left eye exhibits no discharge. No scleral icterus.  Neck: Neck supple. No tracheal deviation present.  Cardiovascular: Normal rate, regular rhythm and intact distal pulses.   Pulmonary/Chest: Effort normal and breath sounds normal. No stridor. No respiratory distress. She has no wheezes. She has no rales.  Abdominal: Soft. Bowel sounds are normal. She exhibits no distension. There is no tenderness. There is no rebound and no guarding.  Musculoskeletal: She exhibits no edema or tenderness.  Neurological: She is alert. She has normal strength. No cranial nerve deficit (no facial droop, extraocular movements intact, no slurred speech) or sensory deficit. She exhibits normal muscle tone. She displays no seizure activity. Coordination normal.  Skin: Skin is warm and dry. No rash noted.  Psychiatric: She has a normal mood and affect.  Nursing note and vitals reviewed.   ED Treatments / Results  Labs (all labs ordered are listed, but only abnormal results are displayed) Labs Reviewed  COMPREHENSIVE METABOLIC PANEL - Abnormal; Notable for the  following:       Result Value   Glucose, Bld 103 (*)    All other components within normal limits  URINALYSIS, ROUTINE W REFLEX MICROSCOPIC - Abnormal; Notable for the following:    Ketones, ur 15 (*)    All other components within normal limits  RAPID STREP SCREEN (NOT AT [facility])  CULTURE, GROUP A STREP ([facility])  LIPASE, BLOOD  CBC  PREGNANCY, URINE     Procedures Procedures (including critical care time)  DIAGNOSTIC STUDIES: Oxygen Saturation is 99% on RA, normal by my interpretation.    COORDINATION OF CARE: 11:46 PM Discussed treatment plan with pt at bedside and pt agreed to plan.  Medications Ordered in ED Medications  sodium chloride 0.9 % bolus 1,000 mL (0 mLs Intravenous Stopped 07/25/16 0124)  ondansetron (ZOFRAN) injection 4 mg (4 mg Intravenous Given 07/25/16 0026)     Initial Impression / Assessment and Plan / ED Course  I have reviewed the triage vital signs and the nursing notes.  Pertinent labs & imaging results that were available during my care of the patient were reviewed by me and considered in my medical decision making (see chart for details).   patient's laboratory tests are reassuring. No evidence of pneumonia. No evidence of severe dehydration.  Her abdominal exam is benign. I doubt appendicitis, cholecystitis or colitis or other more serious illness. I suspect her symptoms may be related to a viral infection. Plan on discharge home with medications for nausea. Follow-up with a primary doctor if not resolved by early next week. Return as needed for worsening symptoms.   Final Clinical Impressions(s) / ED Diagnoses   Final diagnoses:  Non-intractable vomiting with nausea, unspecified vomiting type    New Prescriptions New Prescriptions   ONDANSETRON (ZOFRAN ODT) 8 MG DISINTEGRATING TABLET    Take 1 tablet (8 mg total) by mouth every 8 (eight) hours as needed for nausea or vomiting.   I personally performed the services described in this  documentation, which was scribed in my presence.  The recorded information has been reviewed and is accurate.    Linwood DibblesJon Pepe Mineau, MD 07/25/16 [phone]

## 2016-07-24 NOTE — ED Notes (Signed)
EDP into room, prior to RN assessment, see MD notes, pending orders.   

## 2016-07-25 LAB — URINALYSIS, ROUTINE W REFLEX MICROSCOPIC
BILIRUBIN URINE: NEGATIVE
Glucose, UA: NEGATIVE mg/dL
HGB URINE DIPSTICK: NEGATIVE
Ketones, ur: 15 mg/dL — AB
Leukocytes, UA: NEGATIVE
NITRITE: NEGATIVE
PROTEIN: NEGATIVE mg/dL
Specific Gravity, Urine: 1.029 (ref 1.005–1.030)
pH: 6 (ref 5.0–8.0)

## 2016-07-25 LAB — RAPID STREP SCREEN (MED CTR MEBANE ONLY): STREPTOCOCCUS, GROUP A SCREEN (DIRECT): NEGATIVE

## 2016-07-25 LAB — PREGNANCY, URINE: PREG TEST UR: NEGATIVE

## 2016-07-25 MED ORDER — ONDANSETRON 8 MG PO TBDP: 8.0000 mg | ORAL_TABLET | Freq: Three times a day (TID) | ORAL | 0 refills | Status: DC | PRN

## 2016-07-25 NOTE — Discharge Instructions (Signed)
Take the medications as needed for nausea and vomiting, try to stay hydrated, follow-up with a primary doctor on Monday or Tuesday if her symptoms have not resolved, return as needed for worsening symptoms

## 2016-07-25 NOTE — ED Notes (Signed)
Alert, NAD, calm, interactive, resps e/u, speaking in clear complete sentences, no dyspnea noted, skin W&D, mentions HA, (denies: abd pain, sob, nausea at this time, dizziness). [facility]

## 2016-07-27 LAB — CULTURE, GROUP A STREP (THRC)

## 2016-09-06 ENCOUNTER — Encounter (HOSPITAL_BASED_OUTPATIENT_CLINIC_OR_DEPARTMENT_OTHER): Payer: Self-pay

## 2016-09-06 ENCOUNTER — Emergency Department (HOSPITAL_BASED_OUTPATIENT_CLINIC_OR_DEPARTMENT_OTHER)
Admission: EM | Admit: 2016-09-06 | Discharge: 2016-09-06 | Disposition: A | Discharge status: Home / Self Care | Payer: Medicaid Other | Attending: Emergency Medicine | Admitting: Emergency Medicine

## 2016-09-06 ENCOUNTER — Emergency Department (HOSPITAL_BASED_OUTPATIENT_CLINIC_OR_DEPARTMENT_OTHER): Payer: Medicaid Other

## 2016-09-06 DIAGNOSIS — S060X0A Concussion without loss of consciousness, initial encounter: Secondary | ICD-10-CM | POA: Diagnosis not present

## 2016-09-06 DIAGNOSIS — Y939 Activity, unspecified: Secondary | ICD-10-CM | POA: Insufficient documentation

## 2016-09-06 DIAGNOSIS — Y999 Unspecified external cause status: Secondary | ICD-10-CM | POA: Insufficient documentation

## 2016-09-06 DIAGNOSIS — S0003XA Contusion of scalp, initial encounter: Secondary | ICD-10-CM | POA: Insufficient documentation

## 2016-09-06 DIAGNOSIS — W548XXA Other contact with dog, initial encounter: Secondary | ICD-10-CM | POA: Diagnosis not present

## 2016-09-06 DIAGNOSIS — Y929 Unspecified place or not applicable: Secondary | ICD-10-CM | POA: Diagnosis not present

## 2016-09-06 DIAGNOSIS — S0990XA Unspecified injury of head, initial encounter: Secondary | ICD-10-CM | POA: Diagnosis present

## 2016-09-06 NOTE — ED Provider Notes (Signed)
[facility] Provider Note   CSN: [id number] Arrival date & time: 09/06/16  1507   By signing my name below, I, Freida Busman, attest that this documentation has been prepared under the direction and in the presence of Roxy Horseman, PA-C. Electronically Signed: Freida Busman, Scribe. 09/06/2016. 5:20 PM.  History   Chief Complaint Chief Complaint  Patient presents with  . Head Injury    The history is provided by the patient. No language interpreter was used.     HPI Comments:  Katherine Munoz is a 21 y.o. female who presents to the Emergency Department complaining of a head injury that occurred yesterday. She states she bumped heads with her mastiff yesterday, injured on the forehead. Pt denies LOC but states she cannot fully recall events of last night.  She reports associated throbbing headache and bruising to the left eye. Pt also notes an episode of vomiting but reports drinking last night and states she does not drink ETOH often. No alleviating factors noted.    Past Medical History:  Diagnosis Date  . Cholestasis during pregnancy   . Medical history non-contributory   . Scoliosis     There are no active problems to display for this patient.   Past Surgical History:  Procedure Laterality Date  . TONSILLECTOMY      OB History    Gravida Para Term Preterm AB Living   0 1 1   SAB TAB Ectopic Multiple Live Births   Home Medications    Prior to Admission medications   Not on File    Family History Family History  Problem Relation Age of Onset  . Heart disease Maternal Grandfather     Social History Social History  Substance Use Topics  . Smoking status: Never Smoker  . Smokeless tobacco: Never Used  . Alcohol use No     Allergies   Patient has no known allergies.   Review of Systems Review of Systems  Constitutional: Negative for chills and fever.  Respiratory: Negative for shortness of breath.     Cardiovascular: Negative for chest pain.  Gastrointestinal: Positive for vomiting.  Skin: Positive for color change (bruising ).  Neurological: Positive for headaches. Negative for syncope.  All other systems reviewed and are negative.    Physical Exam Updated Vital Signs BP 116/82 (BP Location: Right Arm)   Pulse 66   Temp 99.4 F (37.4 C) (Oral)   Resp 16   Ht  (1.549 m)   Wt 120 lb (54.4 kg)   LMP 08/16/2016   SpO2 100%   BMI 22.67 kg/m   Physical Exam  Constitutional: She is oriented to person, place, and time. She appears well-developed and well-nourished. No distress.  HENT:  Head: Normocephalic.  Right Ear: External ear normal.  Left Ear: External ear normal.  Left sided periorbital contusion, minimal tenderness, no evidence of entrapment  Eyes: Conjunctivae and EOM are normal. Pupils are equal, round, and reactive to light.  Neck: Normal range of motion. Neck supple.  No pain with neck flexion, no meningismus  Cardiovascular: Normal rate, regular rhythm and normal heart sounds.  Exam reveals no gallop and no friction rub.   No murmur heard. Pulmonary/Chest: Effort normal and breath sounds normal. No respiratory distress. She has no wheezes. She has no rales. She exhibits no tenderness.  Abdominal: Soft. She exhibits no distension and no mass. There is no  tenderness. There is no rebound and no guarding.  Musculoskeletal: Normal range of motion. She exhibits no edema or tenderness.  Normal gait.  Neurological: She is alert and oriented to person, place, and time. She has normal reflexes.  CN 3-12 intact, normal finger to nose, no pronator drift, sensation and strength intact bilaterally.  Skin: Skin is warm and dry.  Psychiatric: She has a normal mood and affect. Her behavior is normal. Judgment and thought content normal.  Nursing note and vitals reviewed.    ED Treatments / Results  DIAGNOSTIC STUDIES:  Oxygen Saturation is 100% on RA, normal by my  interpretation.    COORDINATION OF CARE:  4:43 PM Discussed treatment plan with pt at bedside and pt agreed to plan.  Labs (all labs ordered are listed, but only abnormal results are displayed) Labs Reviewed - No data to display  EKG  EKG Interpretation None       Radiology Ct Head Wo Contrast  Result Date: 09/06/2016 CLINICAL DATA:  Head injury last night.  Vomiting.  Amnesia. EXAM: CT HEAD WITHOUT CONTRAST TECHNIQUE: Contiguous axial images were obtained from the base of the skull through the vertex without intravenous contrast. COMPARISON:  None. FINDINGS: Brain: No evidence of parenchymal hemorrhage or extra-axial fluid collection. No mass lesion, mass effect, or midline shift. No CT evidence of acute infarction. Cerebral volume is age appropriate. No ventriculomegaly. Vascular: No hyperdense vessel or unexpected calcification. Skull: No evidence of calvarial fracture. Sinuses/Orbits: No fluid levels in the visualized paranasal sinuses. Minimal opacification of the anterior left ethmoidal air cells. Other:  The mastoid air cells are unopacified. IMPRESSION: 1. No evidence of acute intracranial abnormality. No evidence of calvarial fracture. 2. Minimal paranasal sinusitis, of uncertain acuity. Electronically Signed   By: Delbert Phenix M.D.   On: 09/06/2016 17:15    Procedures Procedures (including critical care time)  Medications Ordered in ED Medications - No data to display   Initial Impression / Assessment and Plan / ED Course  I have reviewed the triage vital signs and the nursing notes.  Pertinent labs & imaging results that were available during my care of the patient were reviewed by me and considered in my medical decision making (see chart for details).      Patient symptoms consistent with concussion. No focal neurological deficits on physical exam.  Pt observed in the ED.  CT is negative.  Discussed symptoms of post concussive syndrome and reasons to return to the  emergency department including any new  severe headaches, disequilibrium, vomiting, double vision, extremity weakness, difficulty ambulating, or any other concerning symptoms. Patient will be discharged with information pertaining to diagnosis.  Pt is safe for discharge at this time.  Final Clinical Impressions(s) / ED Diagnoses   Final diagnoses:  Concussion without loss of consciousness, initial encounter    New Prescriptions New Prescriptions   No medications on file   I personally performed the services described in this documentation, which was scribed in my presence. The recorded information has been reviewed and is accurate.       Roxy Horseman, PA-C 09/06/16 1811    Melene Plan, DO 09/06/16 [phone]

## 2016-09-06 NOTE — ED Triage Notes (Signed)
Pt states her mastiff head butted her yesterday-pain  to bilat temporal areas-mandible-no LOC but does not recall events after described by her boyfriend-left periorbital bruising/swelling-NAD-steady gait

## 2017-04-03 ENCOUNTER — Encounter (HOSPITAL_BASED_OUTPATIENT_CLINIC_OR_DEPARTMENT_OTHER): Payer: Self-pay | Admitting: *Deleted

## 2017-04-03 ENCOUNTER — Emergency Department (HOSPITAL_BASED_OUTPATIENT_CLINIC_OR_DEPARTMENT_OTHER)
Admission: EM | Admit: 2017-04-03 | Discharge: 2017-04-03 | Disposition: A | Discharge status: Home / Self Care | Payer: Medicaid Other | Attending: Emergency Medicine | Admitting: Emergency Medicine

## 2017-04-03 ENCOUNTER — Other Ambulatory Visit: Payer: Self-pay

## 2017-04-03 DIAGNOSIS — X500XXA Overexertion from strenuous movement or load, initial encounter: Secondary | ICD-10-CM | POA: Diagnosis not present

## 2017-04-03 DIAGNOSIS — Y929 Unspecified place or not applicable: Secondary | ICD-10-CM | POA: Diagnosis not present

## 2017-04-03 DIAGNOSIS — Y93A2 Activity, calisthenics: Secondary | ICD-10-CM | POA: Insufficient documentation

## 2017-04-03 DIAGNOSIS — Y999 Unspecified external cause status: Secondary | ICD-10-CM | POA: Diagnosis not present

## 2017-04-03 DIAGNOSIS — S3982XA Other specified injuries of lower back, initial encounter: Secondary | ICD-10-CM | POA: Diagnosis present

## 2017-04-03 DIAGNOSIS — S39012A Strain of muscle, fascia and tendon of lower back, initial encounter: Secondary | ICD-10-CM

## 2017-04-03 LAB — URINALYSIS, ROUTINE W REFLEX MICROSCOPIC
BILIRUBIN URINE: NEGATIVE
Glucose, UA: NEGATIVE mg/dL
HGB URINE DIPSTICK: NEGATIVE
KETONES UR: NEGATIVE mg/dL
Leukocytes, UA: NEGATIVE
NITRITE: NEGATIVE
PROTEIN: NEGATIVE mg/dL
SPECIFIC GRAVITY, URINE: 1.015 (ref 1.005–1.030)
pH: 7.5 (ref 5.0–8.0)

## 2017-04-03 LAB — PREGNANCY, URINE: PREG TEST UR: NEGATIVE

## 2017-04-03 MED ORDER — NAPROXEN 375 MG PO TABS: 375.0000 mg | ORAL_TABLET | Freq: Two times a day (BID) | ORAL | 0 refills | Status: DC

## 2017-04-03 MED ORDER — NAPROXEN 250 MG PO TABS
500.0000 mg | ORAL_TABLET | Freq: Once | ORAL | Status: AC
  Administered 2017-04-03: 500 mg via ORAL
  Filled 2017-04-03: qty 2

## 2017-04-03 MED ORDER — ACETAMINOPHEN 500 MG PO TABS
1000.0000 mg | ORAL_TABLET | Freq: Once | ORAL | Status: AC
  Administered 2017-04-03: 1000 mg via ORAL
  Filled 2017-04-03: qty 2

## 2017-04-03 NOTE — ED Provider Notes (Signed)
[facility] EMERGENCY DEPARTMENT Provider Note   CSN: [id number] Arrival date & time: 04/03/17  0029     History   Chief Complaint Chief Complaint  Patient presents with  . Back Pain    HPI Katherine Munoz is a 21 y.o. female.  The history is provided by the patient.  Back Pain   This is a new problem. The current episode started more than 2 days ago. The problem occurs constantly. The problem has not changed since onset.Associated with: new lower abdominal exercise regimen. The pain is present in the lumbar spine and sacro-iliac joint. The quality of the pain is described as cramping. The pain does not radiate. The pain is moderate. The symptoms are aggravated by bending, twisting and certain positions. The pain is the same all the time. Pertinent negatives include no chest pain, no fever, no numbness, no weight loss, no headaches, no abdominal pain, no abdominal swelling, no bowel incontinence, no perianal numbness, no bladder incontinence, no dysuria, no pelvic pain, no leg pain, no paresthesias, no paresis, no tingling and no weakness. She has tried nothing for the symptoms. The treatment provided no relief.    Past Medical History:  Diagnosis Date  . Cholestasis during pregnancy   . Medical history non-contributory   . Scoliosis     There are no active problems to display for this patient.   Past Surgical History:  Procedure Laterality Date  . TONSILLECTOMY      OB History    Gravida Para Term Preterm AB Living   2 1 1  0 1 1   SAB TAB Ectopic Multiple Live Births   1     1 1        Home Medications    Prior to Admission medications   Not on File    Family History Family History  Problem Relation Age of Onset  . Heart disease Maternal Grandfather     Social History Social History   Tobacco Use  . Smoking status: Never Smoker  . Smokeless tobacco: Never Used  Substance Use Topics  . Alcohol use: No  . Drug use: No     Allergies     Patient has no known allergies.   Review of Systems Review of Systems  Constitutional: Negative for fever and weight loss.  Respiratory: Negative for shortness of breath.   Cardiovascular: Negative for chest pain.  Gastrointestinal: Negative for abdominal pain, bowel incontinence, constipation, diarrhea, nausea, rectal pain and vomiting.  Genitourinary: Negative for bladder incontinence, difficulty urinating, dyspareunia, dysuria, flank pain, genital sores, pelvic pain, urgency, vaginal bleeding and vaginal discharge.  Musculoskeletal: Positive for back pain.  Neurological: Negative for tingling, weakness, numbness, headaches and paresthesias.  All other systems reviewed and are negative.    Physical Exam Updated Vital Signs BP 130/88 (BP Location: Left Arm)   Pulse 85   Temp 98.3 F (36.8 C) (Oral)   Resp 16   Ht 5\' 1"  (1.549 m)   Wt 54.4 kg (120 lb)   LMP 03/12/2017   SpO2 99%   Breastfeeding? No   BMI 22.67 kg/m   Physical Exam  Constitutional: She is oriented to person, place, and time. She appears well-developed and well-nourished.  HENT:  Head: Normocephalic and atraumatic.  Mouth/Throat: No oropharyngeal exudate.  Eyes: Conjunctivae are normal. Pupils are equal, round, and reactive to light.  Neck: Normal range of motion. Neck supple.  Cardiovascular: Normal rate, regular rhythm, normal heart sounds and intact distal pulses.  Pulmonary/Chest: Effort  normal and breath sounds normal. No stridor. She has no wheezes. She has no rales.  Abdominal: Soft. Bowel sounds are normal. She exhibits no mass. There is no tenderness. There is no rebound and no guarding.  Musculoskeletal: Normal range of motion. She exhibits no tenderness.       Cervical back: Normal.       Thoracic back: Normal.       Lumbar back: Normal.  Reverse C shape scoliosis,   Neurological: She is alert and oriented to person, place, and time. She displays normal reflexes.  Skin: Skin is warm and dry.  Capillary refill takes less than 2 seconds.  Psychiatric: She has a normal mood and affect.  Nursing note and vitals reviewed.    ED Treatments / Results  Labs (all labs ordered are listed, but only abnormal results are displayed)  Results for orders placed or performed during the hospital encounter of 04/03/17  Urinalysis, Routine w reflex microscopic  Result Value Ref Range   Color, Urine YELLOW YELLOW   APPearance CLOUDY (A) CLEAR   Specific Gravity, Urine 1.015 1.005 - 1.030   pH 7.5 5.0 - 8.0   Glucose, UA NEGATIVE NEGATIVE mg/dL   Hgb urine dipstick NEGATIVE NEGATIVE   Bilirubin Urine NEGATIVE NEGATIVE   Ketones, ur NEGATIVE NEGATIVE mg/dL   Protein, ur NEGATIVE NEGATIVE mg/dL   Nitrite NEGATIVE NEGATIVE   Leukocytes, UA NEGATIVE NEGATIVE  Pregnancy, urine  Result Value Ref Range   Preg Test, Ur NEGATIVE NEGATIVE   No results found.   Procedures Procedures (including critical care time)  Medications Ordered in ED Medications  naproxen (NAPROSYN) tablet 500 mg (not administered)  acetaminophen (TYLENOL) tablet 1,000 mg (not administered)       Final Clinical Impressions(s) / ED Diagnoses  MSK pain: will treat with NSAIDS.   All questions answered to the patient's satisfaction.    Strict return precautions for fever, inability to open the mouth, inability to speech, swelling behind the ear or in the neck, fevers,  global weakness, vomiting, swelling or the lips or tongue, chest pain, dyspnea on exertion,shortness of breath, persistent pain, Inability to tolerate liquids or food, cough, altered mental status or any concerns. No signs of systemic illness or infection. The patient is nontoxic-appearing on exam and vital signs are within normal limits.    I have reviewed the triage vital signs and the nursing notes. Pertinent labs &imaging results that were available during my care of the patient were reviewed by me and considered in my medical decision making (see  chart for details).  After history, exam, and medical workup I feel the patient has been appropriately medically screened and is safe for discharge home. Pertinent diagnoses were discussed with the patient. Patient was given return precautions     Viveca Beckstrom, MD 04/03/17 [phone]

## 2017-04-03 NOTE — ED Notes (Signed)
Report received 

## 2017-04-03 NOTE — ED Triage Notes (Signed)
Pt reports 3 days of low back pain and urinary frequency

## 2017-09-04 IMAGING — US USMFM FETAL BPP W/O NON-STRESS ADDL GEST
1 series · 15 of 17 positions shown · non-contrast
Comparison: none

[Series 2: usmfm fetal bpp w/o non-stress addl gest · 17 acquisitions, 15 frames shown]
[im 1/17]
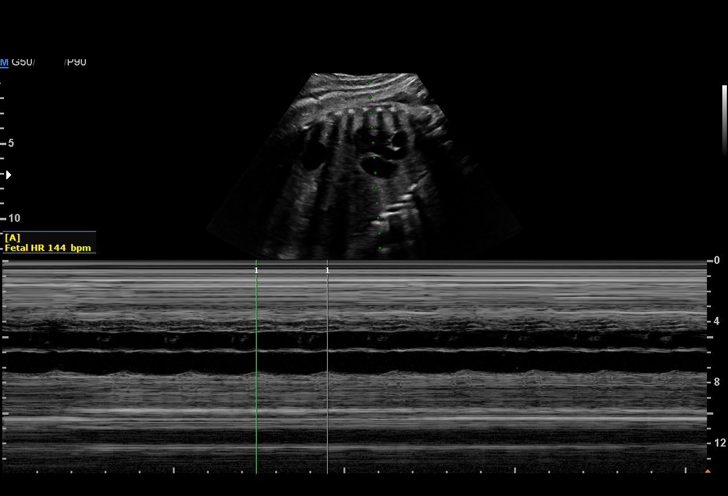
[im 2/17]
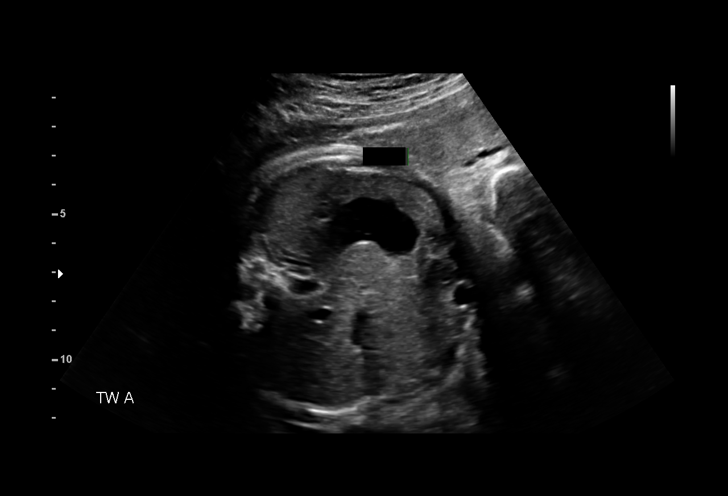
[im 3/17]
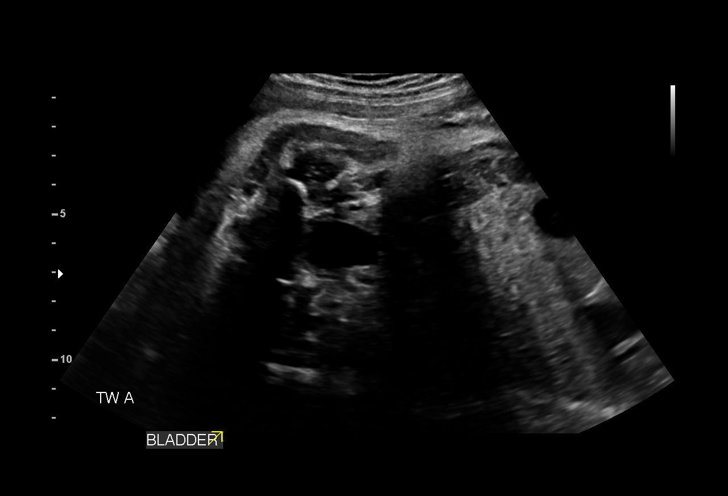
[im 4/17]
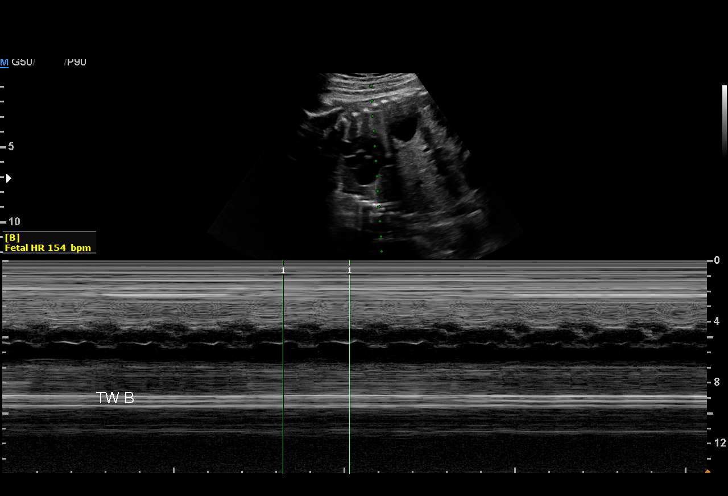
[im 6/17]
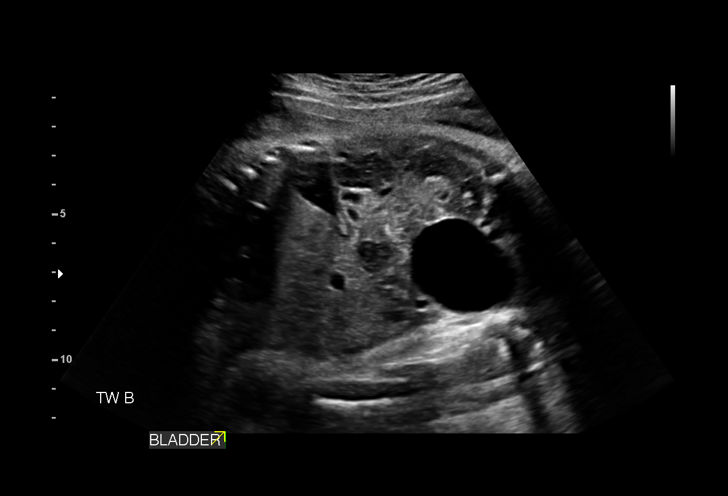
[im 7/17]
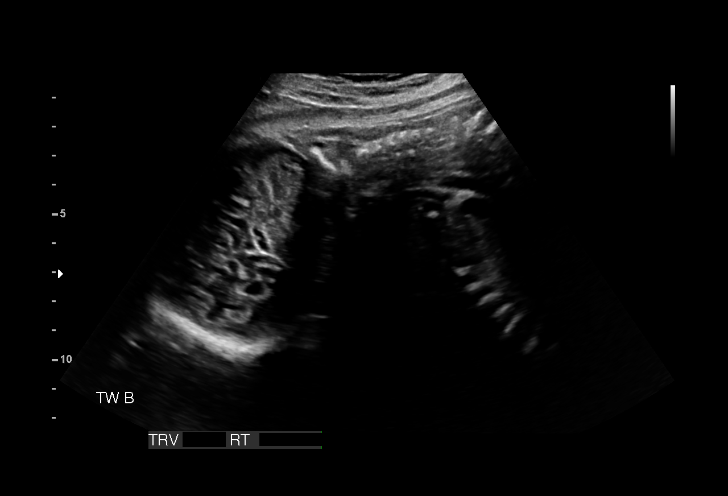
[im 8/17]
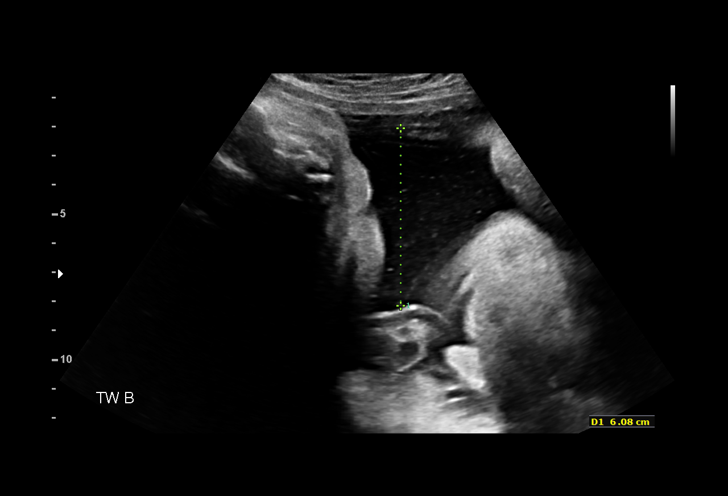
[im 9/17]
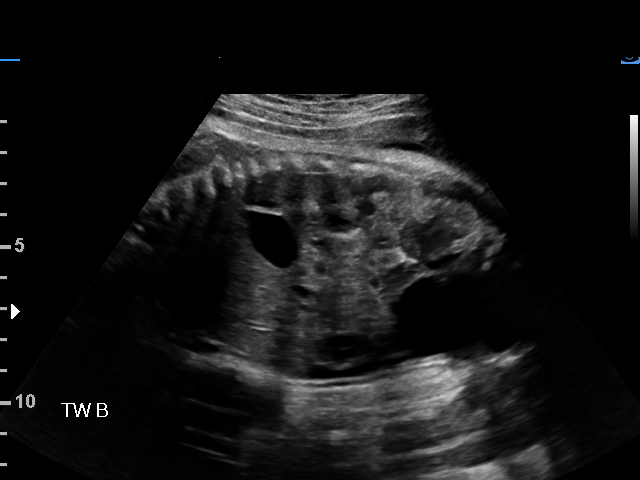
[im 10/17]
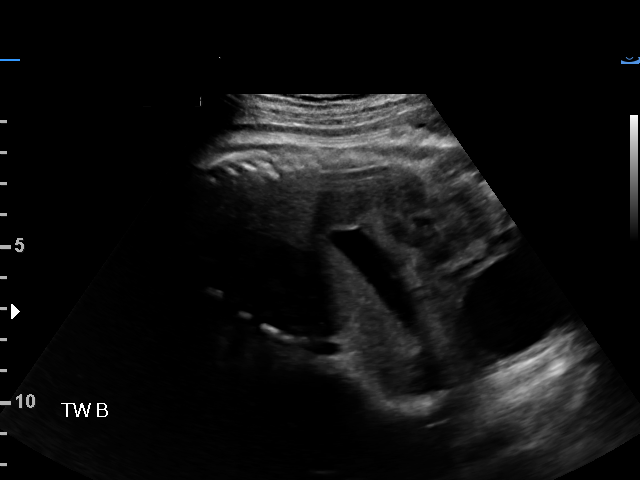
[im 11/17]
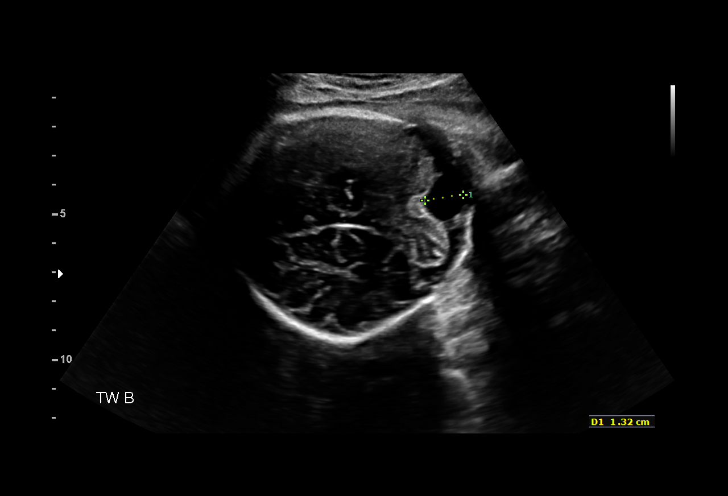
[im 12/17]
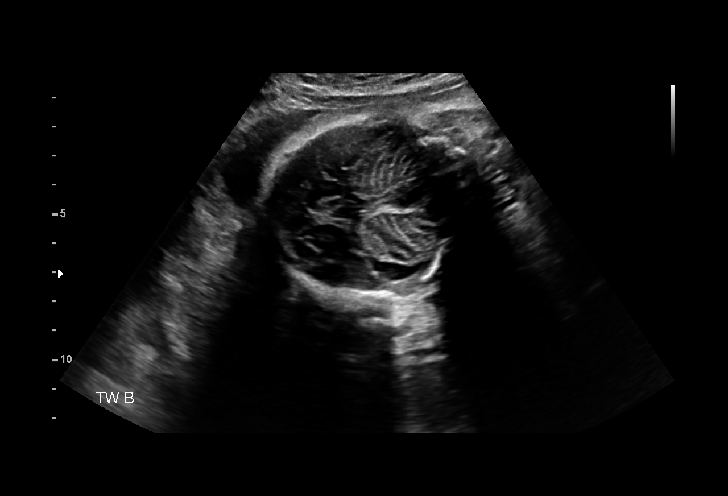
[im 14/17]
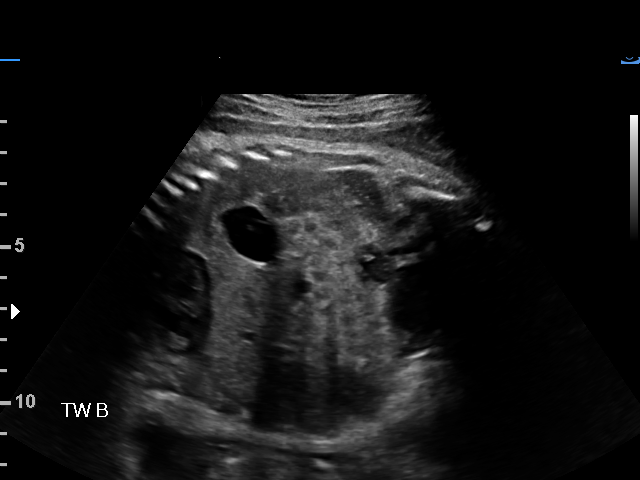
[im 15/17]
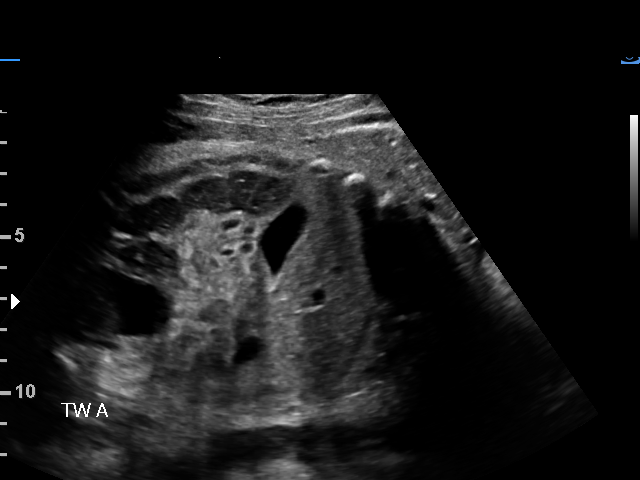
[im 16/17]
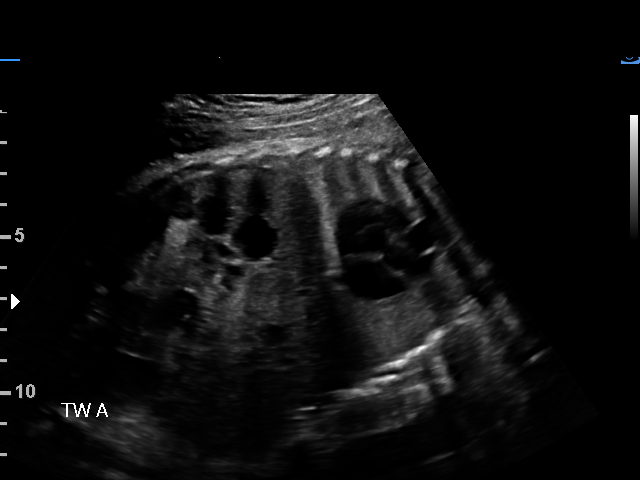
[im 17/17]
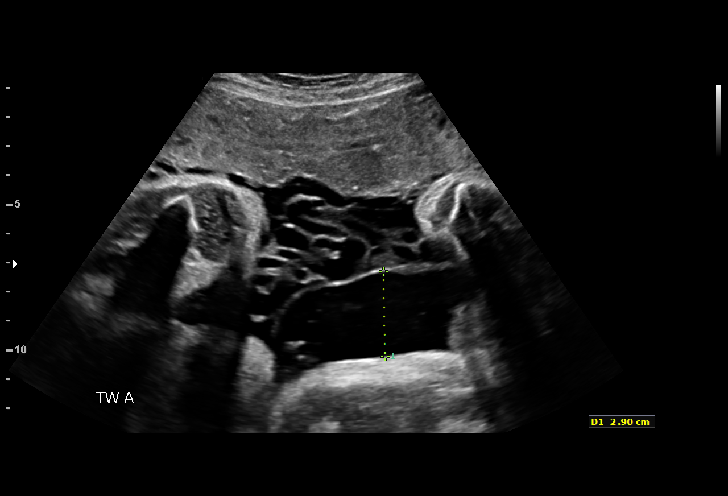

[15 of 17 positions shown; findings below may reference images not displayed]

GESTATION

1  FLACA NOMAN           [id number]      [id number]     [id number]
2  FLACA NOMAN           [id number]      [id number]     [id number]
Indications

35 weeks gestation of pregnancy
Twin pregnancy, di/di, third trimester
Gestational diabetes in pregnancy, diet
controlled
Fetal abnormality - other known or
suspected enlarged cisterna magna Genie Yamashita
OB History

Gravidity:    2         Term:   0        Prem:   0        SAB:   1
TOP:          0       Ectopic:  0        Living: 0
Fetal Evaluation (Fetus A)

Num Of Fetuses:     2
Fetal Heart         144
Rate(bpm):
Cardiac Activity:   Observed
Presentation:       Cephalic

Amniotic Fluid
AFI FV:      Subjectively within normal limits

Largest Pocket(cm)
2.9
Biophysical Evaluation (Fetus A)
Amniotic F.V:   Within normal limits       F. Tone:        Observed
F. Movement:    Observed                   Score:          [DATE]
F. Breathing:   Observed
Gestational Age (Fetus A)

Best:          35w 1d    Det. By:   Early Ultrasound         EDD:   03/09/16
Anatomy (Fetus A)

Stomach:               Appears normal, left   Bladder:                Appears normal
sided

Fetal Evaluation (Fetus B)

Num Of Fetuses:     2
Fetal Heart         154
Rate(bpm):
Cardiac Activity:   Observed
Fetal Lie:          Upper Fetus
Presentation:       Transverse, head to maternal right

Amniotic Fluid
AFI FV:      Subjectively within normal limits

Largest Pocket(cm)
6.1
Biophysical Evaluation (Fetus B)

Amniotic F.V:   Within normal limits       F. Tone:        Observed
F. Movement:    Observed                   Score:          [DATE]
F. Breathing:   Observed
Gestational Age (Fetus B)

Best:          35w 1d    Det. By:   Early Ultrasound         EDD:   03/09/16
Anatomy (Fetus B)

Stomach:               Appears normal, left   Bladder:                Appears normal
sided
Impression

Diamniotic dichorionic twin gestation at 35+1 weeks
Twin B with mildly enlarged cisterna magna
Gestational diabetes
Twin A is cephalic, twin B is transverse with back down and
head to the right
Normal fluid in each sac with MVP of 2.9cm for A and 6.1cm
for B
BPP [DATE] for both twins
Recommendations

Continue close evaluation; BPP scheduled for 02/11/2016

## 2017-09-11 IMAGING — US USMFM FETAL BPP W/O NON-STRESS ADDL GEST
1 series · 12 of 27 positions shown · non-contrast
Comparison: none

[Series 1: usmfm fetal bpp w/o non-stress addl gest · 27 acquisitions, 12 frames shown]
[im 2/27]
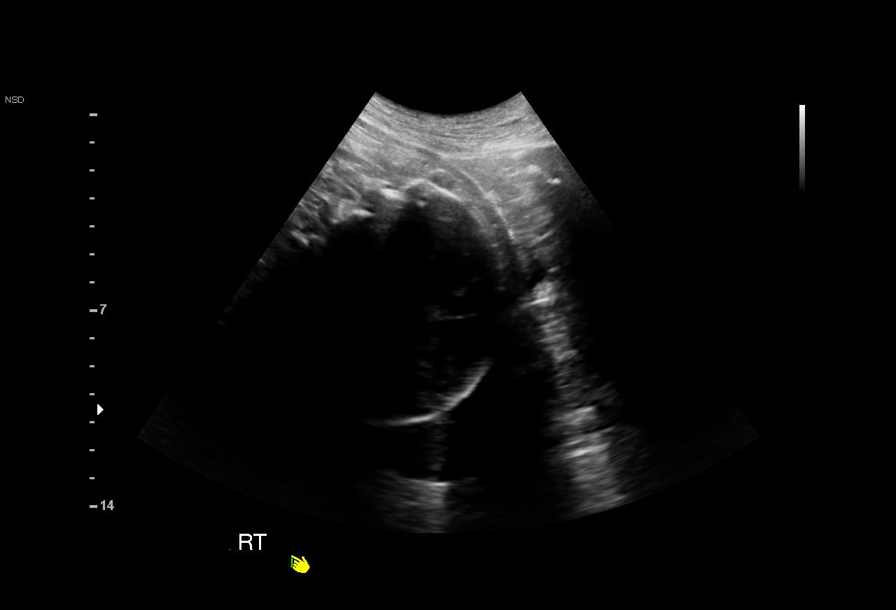
[im 4/27]
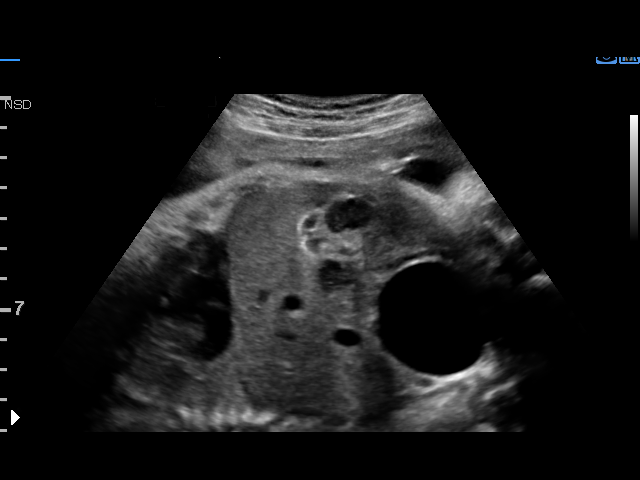
[im 6/27]
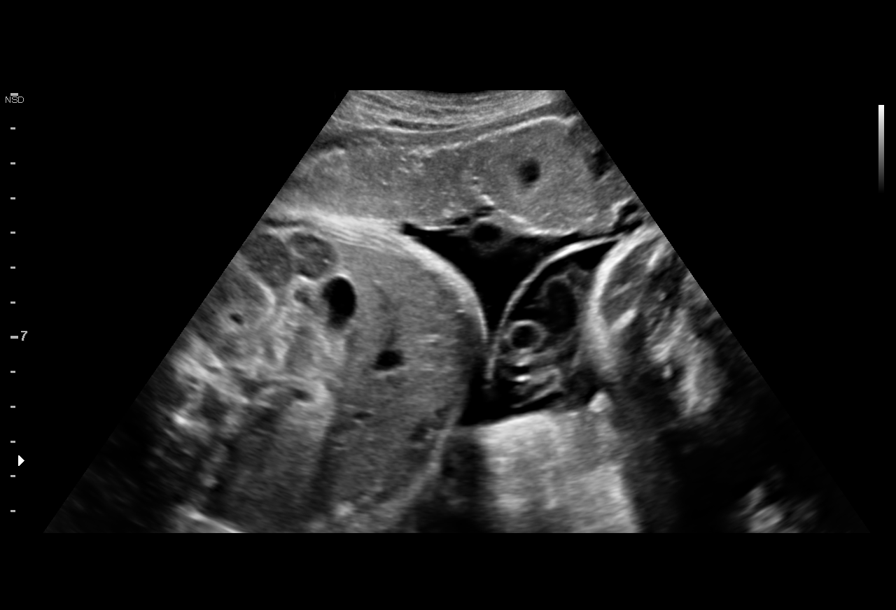
[im 8/27]
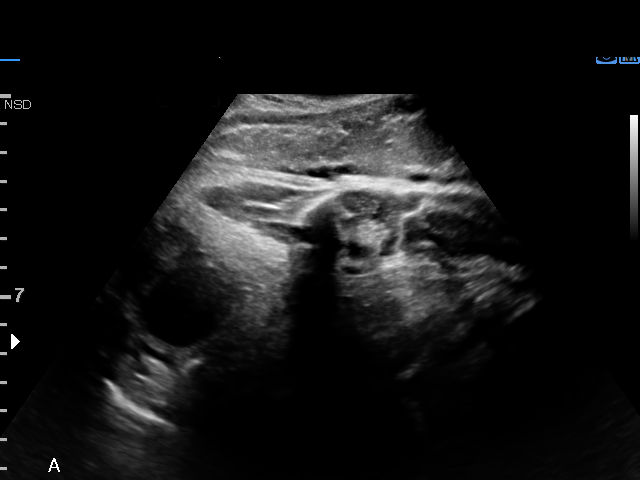
[im 11/27]
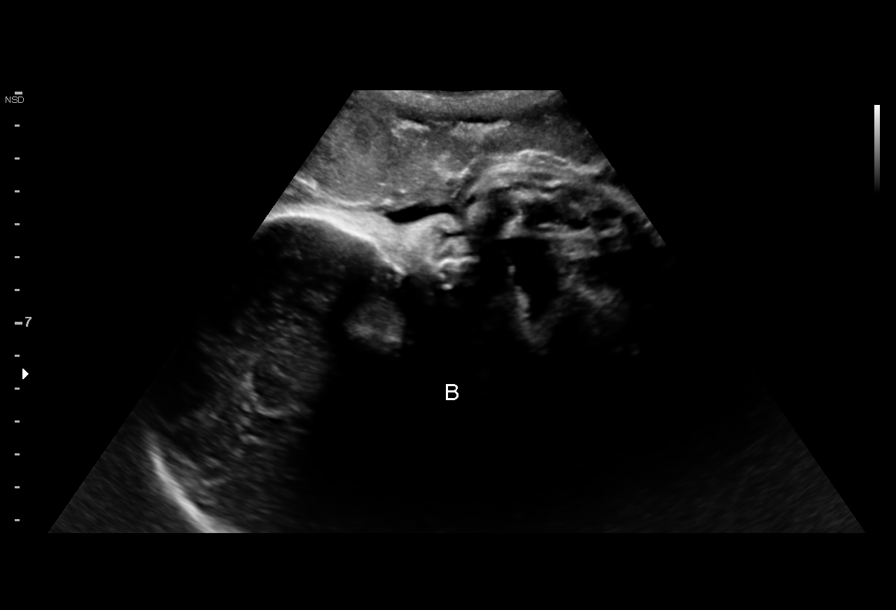
[im 13/27]
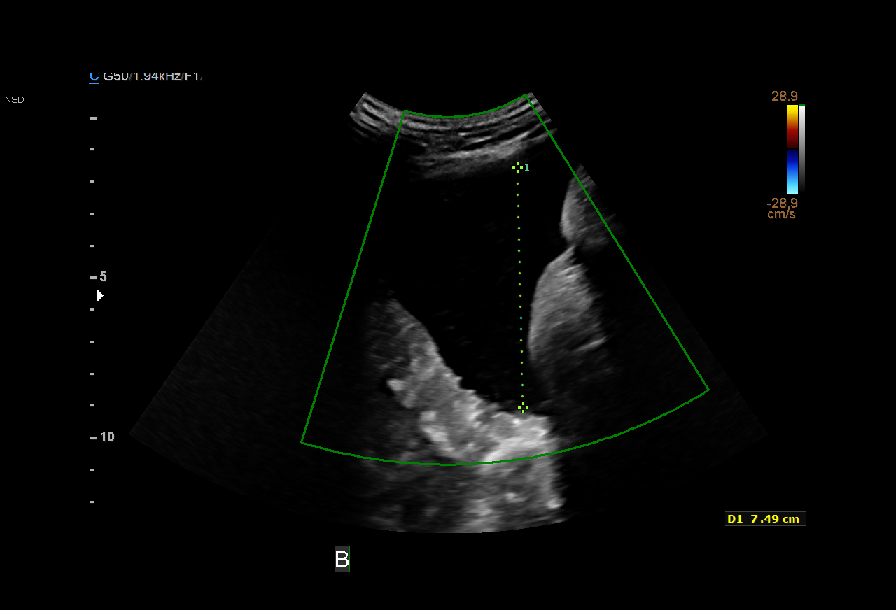
[im 15/27]
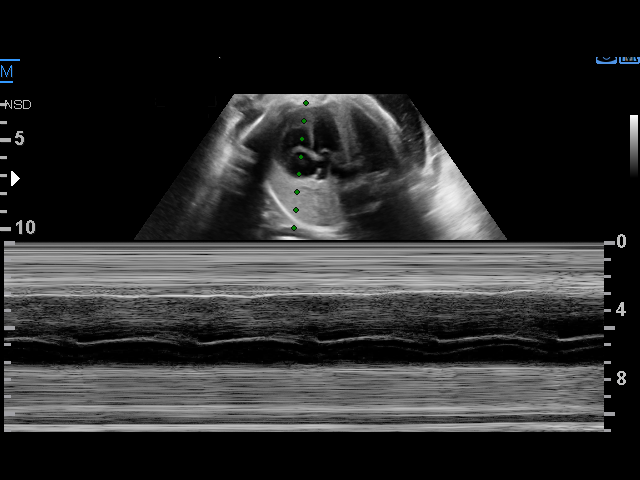
[im 17/27]
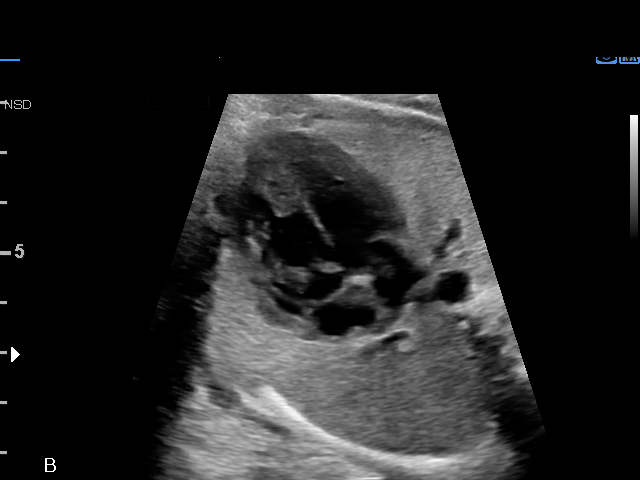
[im 20/27]
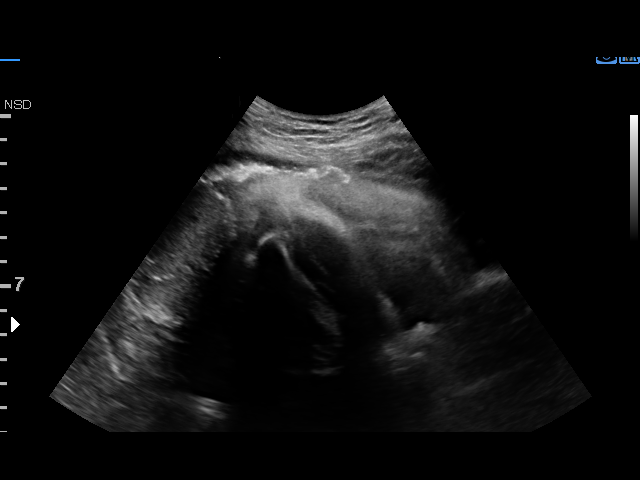
[im 22/27]
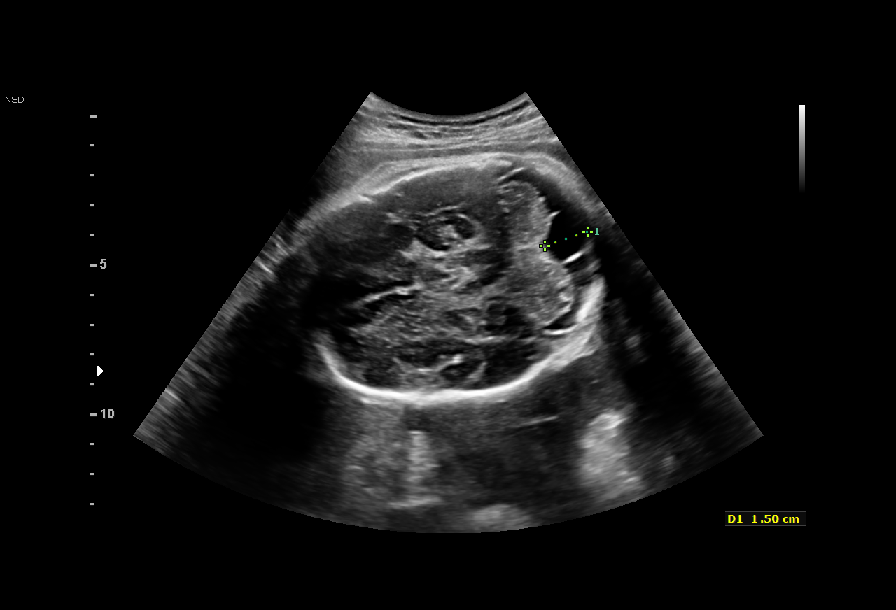
[im 24/27]
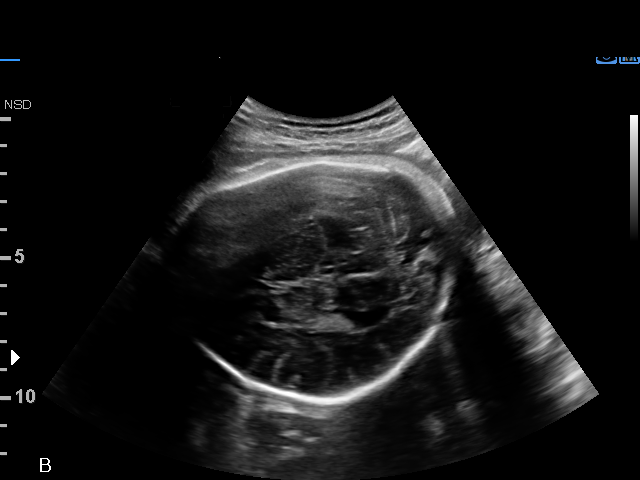
[im 26/27]
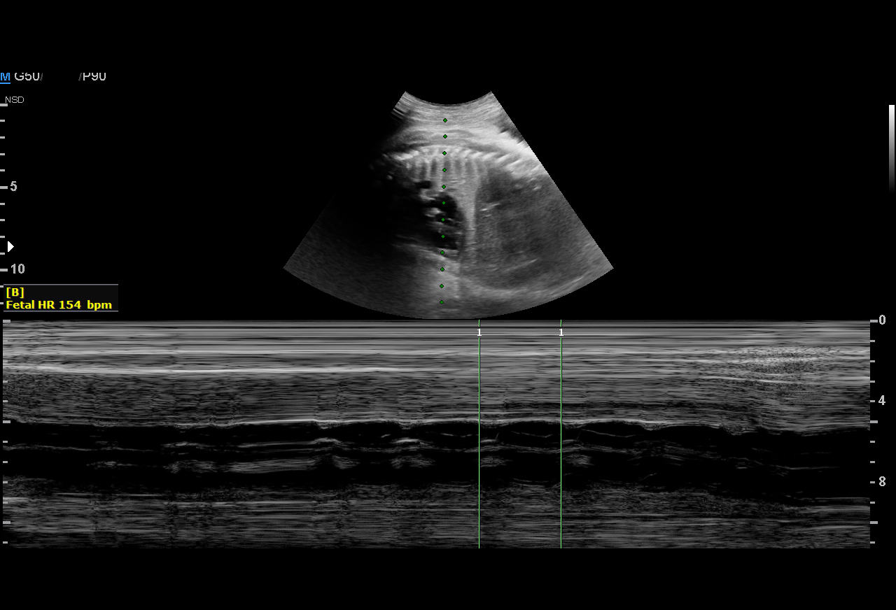

[12 of 27 positions shown; findings below may reference images not displayed]

GESTATION

1  GENUS KATARINA           [id number]      [id number]     [id number]
2  GENUS KATARINA           [id number]      [id number]     [id number]
Indications

36 weeks gestation of pregnancy
Twin pregnancy, di/di, third trimester
Gestational diabetes in pregnancy, diet
controlled
Fetal abnormality - other known or
suspected enlarged cisterna magna Gatto Accardo
OB History

Gravidity:    2         Term:   0        Prem:   0        SAB:   1
TOP:          0       Ectopic:  0        Living: 0
Fetal Evaluation (Fetus A)

Num Of Fetuses:     2
Fetal Heart         140
Rate(bpm):
Cardiac Activity:   Observed
Fetal Lie:          Maternal right side
Presentation:       Cephalic

Amniotic Fluid
AFI FV:      Subjectively within normal limits

Largest Pocket(cm)
5.0
Biophysical Evaluation (Fetus A)
Amniotic F.V:   Pocket => 2 cm two         F. Tone:        Observed
planes
F. Movement:    Observed                   Score:          [DATE]
F. Breathing:   Observed
Gestational Age (Fetus A)

Best:          36w 1d     Det. By:  Early Ultrasound         EDD:   03/09/16

Fetal Evaluation (Fetus B)

Num Of Fetuses:     2
Fetal Heart         154
Rate(bpm):
Cardiac Activity:   Arrhythmia noted
Fetal Lie:          Maternal left side
Presentation:       Breech

Amniotic Fluid
AFI FV:      Subjectively within normal limits

Largest Pocket(cm)
7.5
Biophysical Evaluation (Fetus B)

Amniotic F.V:   Pocket => 2 cm two         F. Tone:        Observed
planes
F. Movement:    Observed                   Score:          [DATE]
F. Breathing:   Observed
Gestational Age (Fetus B)

Best:          36w 1d     Det. By:  Early Ultrasound         EDD:   03/09/16
Comments

Pt has NST in [REDACTED] after US
? Leaking fluid- trickle x 2 days.
Cholestasis- Induction next week.
Impression

Diamniotic Dichorionic intrauterine pregnancy at 36+1 weeks;
Gestational diabetes, enlarged cisterna magna Twin B, newly-
diagnosed intrahepatic cholestasis of pregnancy
Presnetation is cephalic/breech
Normal amniotic fluid in each sac, with  MVP of 5cm for A
and 7.5cm for B
BPP [DATE] for both twins
Recommendations

Patient is going to be delivered at 37 weeks due to ICP and I
agree with that plan. She is going to her clinic appointment
now. I made sure she knew to mention the "leaking fluid".No
further visits necessary in MFM

## 2017-12-19 DIAGNOSIS — J3089 Other allergic rhinitis: Secondary | ICD-10-CM

## 2017-12-19 DIAGNOSIS — E739 Lactose intolerance, unspecified: Secondary | ICD-10-CM | POA: Insufficient documentation

## 2017-12-19 HISTORY — DX: Lactose intolerance, unspecified: E73.9

## 2017-12-19 HISTORY — DX: Other allergic rhinitis: J30.89

## 2018-01-31 ENCOUNTER — Ambulatory Visit: Payer: Medicaid Other | Admitting: Obstetrics & Gynecology

## 2018-01-31 ENCOUNTER — Encounter: Payer: Self-pay | Admitting: Obstetrics & Gynecology

## 2018-01-31 ENCOUNTER — Ambulatory Visit (INDEPENDENT_AMBULATORY_CARE_PROVIDER_SITE_OTHER): Payer: Medicaid Other

## 2018-01-31 VITALS — BP 106/66 | HR 73 | Ht 61.0 in | Wt 128.0 lb

## 2018-01-31 DIAGNOSIS — Z8632 Personal history of gestational diabetes: Secondary | ICD-10-CM | POA: Diagnosis not present

## 2018-01-31 DIAGNOSIS — R102 Pelvic and perineal pain: Secondary | ICD-10-CM

## 2018-01-31 NOTE — Progress Notes (Signed)
Pt c/o pelvic pain for a few months  Pt had pap smear last week- normal

## 2018-01-31 NOTE — Progress Notes (Signed)
Patient ID: Katherine Munoz, female   DOB: 1995-12-18, 22 y.o.   MRN: [id number]  Chief Complaint  Patient presents with  . Pelvic Pain    HPI Katherine Munoz is a 22 y.o. female. Engaged P2 (24 yo twin sons) here today with the issue of pelvic pain for about 3-4 months, dull, constant, all day. Lots of bloating. She has not tried IBU, tylenol. She has changed her diet (FODMAP- no greens except salad, no dairy, no eggs) after seeing a GI. This has helped her pain a little.   She is a runner (at least 3 miles per day). She has had SOB with running this week. Running worsens her pain.  She has dyspareunia for about a month. Same partner for 3 years.  She is not using contraception, would be happy with a pregnancy. HPI  Past Medical History:  Diagnosis Date  . Cholestasis during pregnancy   . Medical history non-contributory   . Scoliosis     Past Surgical History:  Procedure Laterality Date  . TONSILLECTOMY    . WISDOM TOOTH EXTRACTION      Family History  Problem Relation Age of Onset  . Heart disease Maternal Grandfather   . Breast cancer Other     Social History Social History   Tobacco Use  . Smoking status: Never Smoker  . Smokeless tobacco: Never Used  Substance Use Topics  . Alcohol use: Yes    Comment: Socially  . Drug use: No    No Known Allergies  Current Outpatient Medications  Medication Sig Dispense Refill  . cetirizine (ZYRTEC) 10 MG tablet Take 10 mg by mouth daily.  5   No current facility-administered medications for this visit.     Review of Systems Review of Systems Stay at home mom  Blood pressure 106/66, pulse 73, height 5\' 1"  (1.549 m), weight 128 lb (58.1 kg), last menstrual period 12/23/2017, not currently breastfeeding.  Physical Exam Physical Exam  Breathing, conversing, and ambulating normally Well nourished, well hydrated White female, no apparent distress Abd- benign Pelvic exam- normal size and shape, retroverted, mobile,  non-tender, normal adnexal exam   [facility] and last week with her primary care MD  Assessment    Pelvic pain, probably compounded by GI causes Preventative care    Plan    I explained ovarian cyst cycle Schedule gyn u/s Come back for results Rec IBU 400 mg po q6 hour RTC Flu vaccine at next visit (we are out of stock today) Come back 1-2 weeks       Clair Bardwell C Terrel Nesheiwat 01/31/2018, 2:26 PM

## 2018-02-01 LAB — HEMOGLOBIN A1C
Hgb A1c MFr Bld: 5.1 % of total Hgb (ref <5.7)
MEAN PLASMA GLUCOSE: 100 (calc)
eAG (mmol/L): 5.5 (calc)

## 2018-02-14 ENCOUNTER — Ambulatory Visit: Payer: Medicaid Other | Admitting: Advanced Practice Midwife

## 2018-02-15 ENCOUNTER — Ambulatory Visit: Payer: Medicaid Other | Admitting: Obstetrics and Gynecology

## 2018-02-15 ENCOUNTER — Encounter: Payer: Self-pay | Admitting: Obstetrics and Gynecology

## 2018-02-15 ENCOUNTER — Other Ambulatory Visit (HOSPITAL_COMMUNITY)
Admission: RE | Admit: 2018-02-15 | Discharge: 2018-02-15 | Disposition: A | Discharge status: Home / Self Care | Payer: Medicaid Other | Source: Ambulatory Visit | Attending: Obstetrics and Gynecology | Admitting: Obstetrics and Gynecology

## 2018-02-15 VITALS — BP 104/65 | HR 76 | Resp 16 | Ht 61.0 in | Wt 126.0 lb

## 2018-02-15 DIAGNOSIS — Z23 Encounter for immunization: Secondary | ICD-10-CM | POA: Diagnosis not present

## 2018-02-15 DIAGNOSIS — N941 Unspecified dyspareunia: Secondary | ICD-10-CM

## 2018-02-15 DIAGNOSIS — M419 Scoliosis, unspecified: Secondary | ICD-10-CM | POA: Diagnosis not present

## 2018-02-15 NOTE — Progress Notes (Signed)
22 yo presenting today to discuss results of her ultrasound. Ultrasound was order to evaluate a 3-4 month history of pelvic pain and abdominal bloating. Patient reports improvement in her abdominal pain and bloating since her last visit. She reports persistent dyspareunia.  Past Medical History:  Diagnosis Date  . Cholestasis during pregnancy   . Medical history non-contributory   . Scoliosis    Past Surgical History:  Procedure Laterality Date  . TONSILLECTOMY    . WISDOM TOOTH EXTRACTION     Family History  Problem Relation Age of Onset  . Heart disease Maternal Grandfather   . Breast cancer Other    Social History   Tobacco Use  . Smoking status: Never Smoker  . Smokeless tobacco: Never Used  Substance Use Topics  . Alcohol use: Yes    Comment: Socially  . Drug use: No   ROS See pertinent in HPI  Blood pressure 104/65, pulse 76, resp. rate 16, height 5\' 1"  (1.549 m), weight 126 lb (57.2 kg), last menstrual period 01/20/2018, not currently breastfeeding.  GENERAL: Well-developed, well-nourished female in no acute distress.  ABDOMEN: Soft, nontender, nondistended. No organomegaly. PELVIC: Normal external female genitalia. Vagina is pink and rugated.  Normal discharge. Normal appearing cervix. Uterus is normal in size. No adnexal mass or tenderness. EXTREMITIES: No cyanosis, clubbing, or edema, 2+ distal pulses.  01/2018 ultrasound FINDINGS: Uterus  Measurements: 7.6 x 4.3 x 5.8 cm. Retroverted. No fibroids or other mass visualized.  Endometrium  Thickness: 6 mm.  No focal abnormality visualized.  Right ovary  Measurements: 4.0 x 1.5 x 2.7 cm. Normal appearance/no adnexal mass.  Left ovary  Measurements: 2.2 x 1.6 x 1.9 cm. Normal appearance/no adnexal mass.  Other findings:  No abnormal free fluid  IMPRESSION: Negative. No pelvic mass or other significant abnormality identified.   Electronically Signed   By: Myles Rosenthal M.D.   On:  02/01/2018 08:34 A/P 22 yo with normal ultrasound and dyspareunia - ultrasound results reviewed with the patient - wet prep collected - patient will be contacted with abnormal results

## 2018-02-16 LAB — CERVICOVAGINAL ANCILLARY ONLY
BACTERIAL VAGINITIS: NEGATIVE
CANDIDA VAGINITIS: NEGATIVE
CHLAMYDIA, DNA PROBE: NEGATIVE
Neisseria Gonorrhea: NEGATIVE
Trichomonas: NEGATIVE

## 2019-02-08 ENCOUNTER — Encounter: Payer: Self-pay | Admitting: Obstetrics & Gynecology

## 2019-02-08 ENCOUNTER — Ambulatory Visit: Payer: Medicaid Other | Admitting: Obstetrics & Gynecology

## 2019-02-08 ENCOUNTER — Other Ambulatory Visit: Payer: Self-pay

## 2019-02-08 VITALS — BP 109/72 | HR 71 | Resp 16 | Ht 61.0 in | Wt 137.0 lb

## 2019-02-08 DIAGNOSIS — N946 Dysmenorrhea, unspecified: Secondary | ICD-10-CM | POA: Diagnosis not present

## 2019-02-08 DIAGNOSIS — N920 Excessive and frequent menstruation with regular cycle: Secondary | ICD-10-CM

## 2019-02-08 MED ORDER — TRANEXAMIC ACID 650 MG PO TABS: 1300.0000 mg | ORAL_TABLET | Freq: Three times a day (TID) | ORAL | 2 refills | Status: DC

## 2019-02-08 NOTE — Patient Instructions (Signed)
Endometriosis  Endometriosis is a condition in which the tissue that lines the uterus (endometrium) grows outside of its normal location. The tissue may grow in many locations close to the uterus, but it commonly grows on the ovaries, fallopian tubes, vagina, or bowel. When the uterus sheds the endometrium every menstrual cycle, there is bleeding wherever the endometrial tissue is located. This can cause pain because blood is irritating to tissues that are not normally exposed to it. What are the causes? The cause of endometriosis is not known. What increases the risk? You may be more likely to develop endometriosis if you:  Have a family history of endometriosis.  Have never given birth.  Started your period at age 10 or younger.  Have high levels of estrogen in your body.  Were exposed to a certain medicine (diethylstilbestrol) before you were born (in utero).  Had low birth weight.  Were born as a twin, triplet, or other multiple.  Have a BMI of less than 25. BMI is an estimate of body fat and is calculated from height and weight. What are the signs or symptoms? Often, there are no symptoms of this condition. If you do have symptoms, they may:  Vary depending on where your endometrial tissue is growing.  Occur during your menstrual period (most common) or midcycle.  Come and go, or you may go months with no symptoms at all.  Stop with menopause. Symptoms may include:  Pain in the back or abdomen.  Heavier bleeding during periods.  Pain during sex.  Painful bowel movements.  Infertility.  Pelvic pain.  Bleeding more than once a month. How is this diagnosed? This condition is diagnosed based on your symptoms and a physical exam. You may have tests, such as:  Blood tests and urine tests. These may be done to help rule out other possible causes of your symptoms.  Ultrasound, to look for abnormal tissues.  An X-ray of the lower bowel (barium enema).  An  ultrasound that is done through the vagina (transvaginally).  CT scan.  MRI.  Laparoscopy. In this procedure, a lighted, pencil-sized instrument called a laparoscope is inserted into your abdomen through an incision. The laparoscope allows your health care provider to look at the organs inside your body and check for abnormal tissue to confirm the diagnosis. If abnormal tissue is found, your health care provider may remove a small piece of tissue (biopsy) to be examined under a microscope. How is this treated? Treatment for this condition may include:  Medicines to relieve pain, such as NSAIDs.  Hormone therapy. This involves using artificial (synthetic) hormones to reduce endometrial tissue growth. Your health care provider may recommend using a hormonal form of birth control, or other medicines.  Surgery. This may be done to remove abnormal endometrial tissue. ? In some cases, tissue may be removed using a laparoscope and a laser (laparoscopic laser treatment). ? In severe cases, surgery may be done to remove the fallopian tubes, uterus, and ovaries (hysterectomy). Follow these instructions at home:  Take over-the-counter and prescription medicines only as told by your health care provider.  Do not drive or use heavy machinery while taking prescription pain medicine.  Try to avoid activities that cause pain, including sexual activity.  Keep all follow-up visits as told by your health care provider. This is important. Contact a health care provider if:  You have pain in the area between your hip bones (pelvic area) that occurs: ? Before, during, or after your period. ?   In between your period and gets worse during your period. ? During or after sex. ? With bowel movements or urination, especially during your period.  You have problems getting pregnant.  You have a fever. Get help right away if:  You have severe pain that does not get better with medicine.  You have severe  nausea and vomiting, or you cannot eat without vomiting.  You have pain that affects only the lower, right side of your abdomen.  You have abdominal pain that gets worse.  You have abdominal swelling.  You have blood in your stool. This information is not intended to replace advice given to you by your health care provider. Make sure you discuss any questions you have with your health care provider. Document Released: 04/23/2000 Document Revised: 04/08/2017 Document Reviewed: 09/27/2015 Elsevier Patient Education  2020 Elsevier Inc.  

## 2019-02-08 NOTE — Progress Notes (Signed)
History:  23 y.o. [id number] here today for eval of bloating > 1 year. Pt also reports pain in her abd that is worse with he cycles. She also reports rectal pain. The pain is worse with defecation.  Pt reports that her menses are very heavy with blood clots. She uses a super tampon that she changes frequently.   Her cycle lasts 7 days and is heavy for 2 days. Pt is sexually active no pain with intercourse.            The following portions of the patient's history were reviewed and updated as appropriate: allergies, current medications, past family history, past medical history, past social history, past surgical history and problem list.  Review of Systems:  Pertinent items are noted in HPI.    Objective:  Physical Exam Blood pressure 109/72, pulse 71, resp. rate 16, height 5\' 1"  (1.549 m), weight 137 lb (62.1 kg), last menstrual period 01/25/2019.  CONSTITUTIONAL: Well-developed, well-nourished female in no acute distress.  HENT:  Normocephalic, atraumatic EYES: Conjunctivae and EOM are normal. No scleral icterus.  NECK: Normal range of motion SKIN: Skin is warm and dry. No rash noted. Not diaphoretic.No pallor. [place]: Alert and oriented to person, place, and time. Normal coordination.   Abd: Soft, nontender and nondistended Pelvic: Normal appearing external genitalia; normal appearing vaginal mucosa and cervix.  Normal discharge.  Small uterus, no other palpable masses, no uterine or adnexal tenderness    Assessment & Plan:  Primary dysmenorrhea and menorrhagia I have reviewed with pt the possible DDx and the potential treatment options. Pt declines OCPs. I do not believe that surgery is the best first option. Given that pt has significant bleeding, will attempt to slow the bleeding and see if that helps with her cycles.   TXA 1300mg  po tid with cycles on heavy days only not to exceed. 5 days.  Rec 3 month f/u to reassess sx  Total face-to-face time with patient was 17 min.  Greater  than 50% was spent in counseling and coordination of care with the patient.   Kyliee Ortego L. Harraway-Smith, M.D., Cherlynn June

## 2019-02-13 ENCOUNTER — Encounter: Payer: Self-pay | Admitting: Obstetrics & Gynecology

## 2019-03-22 ENCOUNTER — Telehealth: Payer: Self-pay

## 2019-03-22 NOTE — Telephone Encounter (Signed)
Spoke with pt to schedule follow up appt for Jan 2021 per Dr.Harraway Tamala Julian. PT declined appointment.

## 2020-05-10 NOTE — L&D Delivery Note (Signed)
OB/GYN Faculty Practice Delivery Note  Katherine Munoz is a 25 y.o. G3P2013 s/p SVD at [redacted]w[redacted]d. She was admitted for IOL for cholestasis.   ROM: 4h 75m with clear fluid GBS Status: Negative Maximum Maternal Temperature: 98.8  Labor Progress: Presented for induction, was started on pitocin, and then membranes were ruptured. She was continued on pitocin and progressed to complete  Delivery Date/Time: 02/08/2021 at 2058 Delivery: Called to room and patient was complete and pushing. Head delivered LOA. Tight nuchal cord present and delivered through. Shoulder and body delivered in usual fashion and nuchal cord reduced after delivery. Infant with spontaneous cry, placed on mother's abdomen, dried and stimulated. Cord clamped x 2 after 1-minute delay, and cut by father of baby. Cord blood drawn. Placenta delivered spontaneously with gentle cord traction. Fundus firm with massage and Pitocin. Labia, perineum, vagina, and cervix inspected and found to have a right labial laceration that was repaired with 3-0 vicryl on SH with a running subcuticular stitch as well as a U-stitch at region of labial separation at distal end of laceration.  Placenta: Intact, 3V cord, L&D Complications: None Lacerations: Right labial  EBL: 152 cc Analgesia: Epidural   Infant: female  APGARs 72,9  3325g  Warner Mccreedy, MD, MPH OB Fellow, Faculty Practice Center for [facility], [facility] 02/08/2021, 11:59 PM

## 2020-05-19 ENCOUNTER — Telehealth: Payer: Self-pay

## 2020-05-19 ENCOUNTER — Other Ambulatory Visit: Payer: Self-pay

## 2020-05-19 ENCOUNTER — Inpatient Hospital Stay (HOSPITAL_COMMUNITY)
Admission: AD | Admit: 2020-05-19 | Discharge: 2020-05-19 | Disposition: A | Discharge status: Home / Self Care | Payer: Medicaid Other | Attending: Family Medicine | Admitting: Family Medicine

## 2020-05-19 ENCOUNTER — Telehealth: Payer: Self-pay | Admitting: Student

## 2020-05-19 DIAGNOSIS — N939 Abnormal uterine and vaginal bleeding, unspecified: Secondary | ICD-10-CM | POA: Diagnosis present

## 2020-05-19 DIAGNOSIS — Z3202 Encounter for pregnancy test, result negative: Secondary | ICD-10-CM

## 2020-05-19 LAB — COMPREHENSIVE METABOLIC PANEL
ALT: 16 U/L (ref 0–44)
AST: 19 U/L (ref 15–41)
Albumin: 4.2 g/dL (ref 3.5–5.0)
Alkaline Phosphatase: 47 U/L (ref 38–126)
Anion gap: 10 (ref 5–15)
BUN: 10 mg/dL (ref 6–20)
CO2: 24 mmol/L (ref 22–32)
Calcium: 9.3 mg/dL (ref 8.9–10.3)
Chloride: 103 mmol/L (ref 98–111)
Creatinine, Ser: 0.74 mg/dL (ref 0.44–1.00)
GFR, Estimated: >60 mL/min (ref >60)
Glucose, Bld: 100 mg/dL — ABNORMAL HIGH (ref 70–99)
Potassium: 4 mmol/L (ref 3.5–5.1)
Sodium: 137 mmol/L (ref 135–145)
Total Bilirubin: 0.6 mg/dL (ref 0.3–1.2)
Total Protein: 7.1 g/dL (ref 6.5–8.1)

## 2020-05-19 LAB — CBC
HCT: 41.2 % (ref 36.0–46.0)
Hemoglobin: 13.7 g/dL (ref 12.0–15.0)
MCH: 29.3 pg (ref 26.0–34.0)
MCHC: 33.3 g/dL (ref 30.0–36.0)
MCV: 88 fL (ref 80.0–100.0)
Platelets: 182 10*3/uL (ref 150–400)
RBC: 4.68 MIL/uL (ref 3.87–5.11)
RDW: 12.2 % (ref 11.5–15.5)
WBC: 4.4 10*3/uL (ref 4.0–10.5)
nRBC: 0 % (ref 0.0–0.2)

## 2020-05-19 LAB — HCG, QUANTITATIVE, PREGNANCY: hCG, Beta Chain, Quant, S: 5 m[IU]/mL — ABNORMAL HIGH (ref <5)

## 2020-05-19 LAB — POCT PREGNANCY, URINE: Preg Test, Ur: NEGATIVE

## 2020-05-19 NOTE — Telephone Encounter (Addendum)
Returned pt call. LMP 04/17/20 and pt has NOB appt on 06/27/20. Pt states she is having heavy, bright red bleeding that is increasing. Pt was instructed to go to MAU. Dr.Leggett in office today and agreed with plan to send pt to MAU. Pt expressed understanding.

## 2020-05-19 NOTE — MAU Note (Signed)
Katherine Munoz is a 25 y.o. here in MAU reporting: had + UPT on 05/14/20. For the past 2 days has been spotting, had IC last night and then bleeding got a little bit heavier. About an hour ago bleeding got more bright red and was told to come in. Having some cramping in her lower back.  LMP: 04/17/20  Onset of complaint: ongoing  Pain score: 5/10  Vitals:   05/19/20 1207  BP: 124/85  Pulse: 97  Resp: 16  Temp: 98.2 F (36.8 C)  SpO2: 97%     Lab orders placed from triage: UPT

## 2020-05-19 NOTE — MAU Provider Note (Addendum)
Patient Katherine Munoz is a 25 y.o. G2P1012  at Unknown here with complaints of bleeding and a positive home pregnancy.   She had a positive home pregnancy test on 1/4; she also had positive home pregnancy tests on Wednesday, January 5.   She had spotting on Saturday, which progress to heavier clots and bleeding. She denies abdominal pain but has back pain. She only sees bleeding when she wipes now.   Will draw beta HCG and call patient in two hours.  Blood type on file is O pos  Patient Vitals for the past 24 hrs:  BP Temp Temp src Pulse Resp SpO2 Height Weight  05/19/20 1207 124/85 98.2 F (36.8 C) Oral 97 16 97 % -- --  05/19/20 1204 -- -- -- -- -- -- 5\' 1"  (1.549 m) 69.6 kg    Equan Cogbill 05/19/2020, 12:21 PM

## 2020-05-19 NOTE — MAU Note (Signed)
Pt to leave after labs are drawn. Pt to remain on board until results are back.

## 2020-05-19 NOTE — Telephone Encounter (Signed)
Telephone call to Patient Katherine Munoz. Patient's quant is 5, indicating that she is not pregnant and has had a failed pregnancy. She denies fever, abdominal tenderness, foul-smelling discharge. She declines anything for nausea and pain at this time.   Patient will have a follow up visit at [facility] this week or early next week; message sent to the office.  Luna Kitchens

## 2020-05-23 ENCOUNTER — Ambulatory Visit (INDEPENDENT_AMBULATORY_CARE_PROVIDER_SITE_OTHER): Payer: Self-pay | Admitting: Certified Nurse Midwife

## 2020-05-23 ENCOUNTER — Encounter: Payer: Self-pay | Admitting: Certified Nurse Midwife

## 2020-05-23 ENCOUNTER — Other Ambulatory Visit: Payer: Self-pay

## 2020-05-23 VITALS — BP 116/74 | HR 80 | Resp 16 | Ht 61.0 in | Wt 153.0 lb

## 2020-05-23 DIAGNOSIS — O039 Complete or unspecified spontaneous abortion without complication: Secondary | ICD-10-CM

## 2020-05-23 DIAGNOSIS — Z6790 Unspecified blood type, Rh positive: Secondary | ICD-10-CM

## 2020-05-23 LAB — CBC: MCV: 87.3 fL (ref 80.0–100.0)

## 2020-05-23 NOTE — Progress Notes (Signed)
   GYNECOLOGY OFFICE VISIT NOTE  History:  25 y.o. [id number] here today for follow up after SAB. She presented to MAU on 05/19/20 with VB that started on 05/17/20. By LMP was [redacted]w[redacted]d at that time. Had 2 +HPT a few days prior. UPT in MAU was negative and qhcg was 5. She is still bleeding but tapering off. Changes menstrual cup 2x day. Passing small dime sized clots. Cramping has resolved. Feels dizzy at times. No syncope. Emotionally feeling well. Has waves of feeling sad. This pregnancy was planned and desired. She would like to become pregnant again. Partner is present for the visit.   Past Medical History:  Diagnosis Date  . Cholestasis during pregnancy   . IBS (irritable bowel syndrome)   . Medical history non-contributory   . Scoliosis     Past Surgical History:  Procedure Laterality Date  . TONSILLECTOMY    . WISDOM TOOTH EXTRACTION      The following portions of the patient's history were reviewed and updated as appropriate: allergies, current medications, past family history, past medical history, past social history, past surgical history and problem list.   Health Maintenance:  Normal pap and negative HRHPV on 01/2018.    Review of Systems:  Negative except noted in HPI Genito-Urinary ROS: positive for - VB negative for - pelvic pain  Objective:  Physical Exam BP 116/74   Pulse 80   Resp 16   Ht 5\' 1"  (1.549 m)   Wt 153 lb (69.4 kg)   BMI 28.91 kg/m  CONSTITUTIONAL: Well-developed, well-nourished female in no acute distress.  HENT:  Normocephalic, atraumatic EYES: Conjunctivae and EOM are normal NECK: Normal range of motion NEUROLOGIC: Alert and oriented to person, place, and time PSYCHIATRIC: Normal mood and affect CARDIOVASCULAR: Normal heart rate noted RESPIRATORY: Effort and rate normal PELVIC: Deferred MUSCULOSKELETAL: Normal range of motion  Labs and Imaging No results found for this or any previous visit (from the past 24 hour(s)).  Assessment & Plan:   1.  Miscarriage   2. Blood type, Rh positive    Doing well Discussed expectations of bleeding Ok to resume IC once bleeding has stopped Ok to attempt conception after first menses Continue PNV CBC qHCG  Follow up for annual or prn  Total face-to-face time with patient: 15 minutes  , Donette Larry 05/23/2020 10:21 AM

## 2020-05-24 LAB — CBC
HCT: 40.5 % (ref 35.0–45.0)
Hemoglobin: 13.7 g/dL (ref 11.7–15.5)
MCH: 29.5 pg (ref 27.0–33.0)
MCHC: 33.8 g/dL (ref 32.0–36.0)
MPV: 10.7 fL (ref 7.5–12.5)
Platelets: 192 10*3/uL (ref 140–400)
RBC: 4.64 10*6/uL (ref 3.80–5.10)
RDW: 12.1 % (ref 11.0–15.0)
WBC: 3.2 10*3/uL — ABNORMAL LOW (ref 3.8–10.8)

## 2020-05-24 LAB — HCG, QUANTITATIVE, PREGNANCY: HCG, Total, QN: <3 m[IU]/mL

## 2020-05-26 ENCOUNTER — Other Ambulatory Visit: Payer: Self-pay | Admitting: Certified Nurse Midwife

## 2020-05-26 DIAGNOSIS — D72819 Decreased white blood cell count, unspecified: Secondary | ICD-10-CM

## 2020-05-26 NOTE — Progress Notes (Signed)
WBC 3.2, repeat in 2 weeks

## 2020-06-27 ENCOUNTER — Encounter: Payer: Medicaid Other | Admitting: Certified Nurse Midwife

## 2020-07-28 ENCOUNTER — Telehealth: Payer: Self-pay

## 2020-07-28 NOTE — Telephone Encounter (Signed)
Returning pt call. Pt states she is 9 or [redacted] weeks pregnant and is feeling "kicks" and is concerned considering she is so early. She state she had a miscarriage in Jan 2022 and is concerned she didn't actually have a miscarriage. I told pt that based off of provider's note and labs from MAU she did have a miscarriage in Jan. I told her that she could also be having gas and that could be what she is feeling. I instructed pt to keep scheduled NOB appt for 07/30/20 and told pt the ultrasound would show accurate dating and provider could discuss concerns with her at that visit. Pt expressed understanding.

## 2020-07-30 ENCOUNTER — Other Ambulatory Visit: Payer: Self-pay

## 2020-07-30 ENCOUNTER — Other Ambulatory Visit (HOSPITAL_COMMUNITY)
Admission: RE | Admit: 2020-07-30 | Discharge: 2020-07-30 | Disposition: A | Discharge status: Home / Self Care | Payer: Medicaid Other | Source: Ambulatory Visit | Attending: Obstetrics and Gynecology | Admitting: Obstetrics and Gynecology

## 2020-07-30 ENCOUNTER — Ambulatory Visit (INDEPENDENT_AMBULATORY_CARE_PROVIDER_SITE_OTHER): Payer: Medicaid Other | Admitting: Obstetrics and Gynecology

## 2020-07-30 VITALS — BP 119/74 | HR 96 | Wt 154.0 lb

## 2020-07-30 DIAGNOSIS — Z8719 Personal history of other diseases of the digestive system: Secondary | ICD-10-CM

## 2020-07-30 DIAGNOSIS — Z8632 Personal history of gestational diabetes: Secondary | ICD-10-CM

## 2020-07-30 DIAGNOSIS — Z3480 Encounter for supervision of other normal pregnancy, unspecified trimester: Secondary | ICD-10-CM

## 2020-07-30 DIAGNOSIS — Z3401 Encounter for supervision of normal first pregnancy, first trimester: Secondary | ICD-10-CM

## 2020-07-30 DIAGNOSIS — Z8759 Personal history of other complications of pregnancy, childbirth and the puerperium: Secondary | ICD-10-CM

## 2020-07-30 MED ORDER — ASPIRIN 81 MG PO CHEW: 81.0000 mg | CHEWABLE_TABLET | Freq: Every day | ORAL | 1 refills | Status: DC

## 2020-07-30 NOTE — Progress Notes (Signed)
History:   Katherine Munoz is a 25 y.o. G3P1012 at 23w6dby early ultrasound being seen today for her first obstetrical visit.  Her obstetrical history is significant for History of GDM with previous pregnancy. and cholelithiasis. Patient does intend to breast feed. Pregnancy history fully reviewed.  Twin boys are 27years old. Previous vaginal delivery.   Patient reports no complaints. FOB present today (Beatriz Chancellor Planned pregnancy.       HISTORY: OB History  Gravida Para Term Preterm AB Living  _0 0 1 2  SAB IAB Ectopic Multiple Live Births  1 0 0 1 1    # Outcome Date GA Lbr Len/2nd Weight Sex Delivery Anes PTL Lv  3 Current           2 SAB 05/2020 418w0d       1A Term 02/18/16 374w1d:10 / 01:17 6 lb 2.9 oz (2.805 kg) M Vag-Spont EPI, Local  LIV     Name: Katherine Munoz Katherine Munoz     Apgar1: 8  Apgar5: 9  1B Term 02/18/16 37w54w1d10 / 01:23 5 lb 14.2 oz (2.67 kg) M [place]  LIV     Name: Katherine Munoz Katherine Munoz     Apgar1: 1  Apgar5: 9    Last pap smear was done 2017 and was normal  Past Medical History:  Diagnosis Date  . Cholestasis during pregnancy   . IBS (irritable bowel syndrome)   . Medical history non-contributory   . Scoliosis    Past Surgical History:  Procedure Laterality Date  . TONSILLECTOMY    . WISDOM TOOTH EXTRACTION     Family History  Problem Relation Age of Onset  . Heart disease Maternal Grandfather   . Breast cancer Other    Social History   Tobacco Use  . Smoking status: Never Smoker  . Smokeless tobacco: Never Used  Vaping Use  . Vaping Use: Never used  Substance Use Topics  . Alcohol use: Yes    Comment: Socially  . Drug use: No   No Known Allergies Current Outpatient Medications on File Prior to Visit  Medication Sig Dispense Refill  . Prenatal Vit-Fe Fumarate-FA (PRENATAL 19 PO) Take by mouth.     No current facility-administered medications on file prior to visit.    Review of Systems Pertinent items noted in HPI  and remainder of comprehensive ROS otherwise negative.  Physical Exam:   Vitals:   07/28/20 1442  BP: 119/74  Pulse: 96  Weight: 154 lb (69.9 kg)     Bedside Ultrasound for FHR check: Viable intrauterine pregnancy with positive cardiac activity noted. Patient informed that the ultrasound is considered a limited obstetric ultrasound and is not intended to be a complete ultrasound exam.  Patient also informed that the ultrasound is not being completed with the intent of assessing for fetal or placental anomalies or any pelvic abnormalities.  Explained that the purpose of today's ultrasound is to assess for fetal heart rate.  Patient acknowledges the purpose of the exam and the limitations of the study. General: well-developed, well-nourished female in no acute distress  Breasts:  normal appearance, no masses or tenderness bilaterally  Skin: normal coloration and turgor, no rashes  Neurologic: oriented, normal, negative, normal mood  Extremities: normal strength, tone, and muscle mass, ROM of all joints is normal  HEENT PERRLA, extraocular movement intact and sclera clear, anicteric  Neck supple and no masses  Cardiovascular: regular rate and rhythm  Respiratory:  no  respiratory distress, normal breath sounds  Abdomen: soft, non-tender; bowel sounds normal; no masses,  no organomegaly  Pelvic: normal external genitalia, no lesions, normal vaginal mucosa, normal vaginal discharge, normal cervix, pap smear done. Uterine size:      Assessment:    Pregnancy: [id number] Patient Active Problem List   Diagnosis Date Noted  . History of gestational diabetes 08/01/2020  . Supervision of low-risk first pregnancy, first trimester 07/30/2020     Plan:    1. Supervision of other normal pregnancy, antepartum  - Obstetric panel - HIV antibody (with reflex) - Hepatitis C Antibody - Culture, OB Urine - Babyscripts Schedule Optimization - Cytology - PAP( [place]) - Hemoglobinopathy  Evaluation - Bile acids, total - HgB A1c - She is to return around 11 weeks for NIPS testing.   2. Supervision of low-risk first pregnancy, first trimester  - [place] MFM OB DETAIL +14 WK; Future  3. History of cholestasis during pregnancy  - Bile acids, total - HgB A1c - Comp Met (CMET)  4. History of gestational diabetes  Rx: ASA daily starting @ 12 weeks.  A1C today.   Initial labs drawn. Continue prenatal vitamins. Problem list reviewed and updated. Genetic Screening discussed, NIPS: requested. Ultrasound discussed; fetal anatomic survey: ordered. Anticipatory guidance about prenatal visits given including labs, ultrasounds, and testing. Discussed usage of Babyscripts and virtual visits as additional source of managing and completing prenatal visits in midst of coronavirus and pandemic.   Encouraged to complete MyChart Registration for her ability to review results, send requests, and have questions addressed.  The nature of [place] for [facility] Healthcare/Faculty Practice with multiple MDs and Advanced Practice Providers was explained to patient; also emphasized that residents, students are part of our team. Routine obstetric precautions reviewed. Encouraged to seek out care at office or emergency room [facility] MAU preferred) for urgent and/or emergent concerns. Return in about 1 week (around 08/06/2020), or for blood work only.Marland Kitchen     Verita Schneiders, MD, [place] for [employer], [place]

## 2020-07-30 NOTE — Progress Notes (Unsigned)
Bedside U/S shows single IUP with FHT of 178 BPM and CRL measures 29.57mm  GA [redacted]w[redacted]d

## 2020-07-30 NOTE — Progress Notes (Signed)
Last pap 2017 

## 2020-07-31 LAB — COMPREHENSIVE METABOLIC PANEL
AG Ratio: 1.5 (calc) (ref 1.0–2.5)
ALT: 12 U/L (ref 6–29)
AST: 15 U/L (ref 10–30)
Albumin: 4 g/dL (ref 3.6–5.1)
Alkaline phosphatase (APISO): 55 U/L (ref 31–125)
BUN: 8 mg/dL (ref 7–25)
CO2: 24 mmol/L (ref 20–32)
Calcium: 9.4 mg/dL (ref 8.6–10.2)
Chloride: 103 mmol/L (ref 98–110)
Creat: 0.71 mg/dL (ref 0.50–1.10)
Globulin: 2.7 g/dL (calc) (ref 1.9–3.7)
Glucose, Bld: 80 mg/dL (ref 65–99)
Potassium: 4.1 mmol/L (ref 3.5–5.3)
Sodium: 135 mmol/L (ref 135–146)
Total Bilirubin: 0.4 mg/dL (ref 0.2–1.2)
Total Protein: 6.7 g/dL (ref 6.1–8.1)

## 2020-07-31 LAB — OBSTETRIC PANEL
Hepatitis B Surface Ag: NONREACTIVE
RBC: 4.36 10*6/uL (ref 3.80–5.10)
Rubella: 1.09 Index

## 2020-07-31 LAB — HEMOGLOBIN A1C
Mean Plasma Glucose: 103 mg/dL
eAG (mmol/L): 5.7 mmol/L

## 2020-07-31 LAB — HEPATITIS C ANTIBODY: SIGNAL TO CUT-OFF: 0.01 (ref <1.00)

## 2020-08-01 DIAGNOSIS — Z8632 Personal history of gestational diabetes: Secondary | ICD-10-CM | POA: Insufficient documentation

## 2020-08-01 DIAGNOSIS — O24419 Gestational diabetes mellitus in pregnancy, unspecified control: Secondary | ICD-10-CM

## 2020-08-01 HISTORY — DX: Gestational diabetes mellitus in pregnancy, unspecified control: O24.419

## 2020-08-01 LAB — CULTURE, OB URINE

## 2020-08-01 LAB — URINE CULTURE, OB REFLEX

## 2020-08-03 LAB — BILE ACIDS, TOTAL: Bile Acids Total: 12 umol/L (ref 0–19)

## 2020-08-03 LAB — HEMOGLOBIN A1C: Hgb A1c MFr Bld: 5.2 % of total Hgb (ref <5.7)

## 2020-08-04 LAB — HEPATITIS C ANTIBODY: Hepatitis C Ab: NONREACTIVE

## 2020-08-04 LAB — HEMOGLOBINOPATHY EVALUATION
Fetal Hemoglobin Testing: <1 % (ref 0.0–1.9)
HCT: 40.2 % (ref 35.0–45.0)
Hemoglobin A2 - HGBRFX: 2.4 % (ref 2.2–3.2)
Hemoglobin: 13.2 g/dL (ref 11.7–15.5)
Hgb A: 97.6 % (ref >96.0)
MCH: 29.9 pg (ref 27.0–33.0)
MCV: 91 fL (ref 80.0–100.0)
RBC: 4.42 10*6/uL (ref 3.80–5.10)
RDW: 12.5 % (ref 11.0–15.0)

## 2020-08-04 LAB — OBSTETRIC PANEL
Absolute Monocytes: 482 cells/uL (ref 200–950)
Antibody Screen: NOT DETECTED
Basophils Absolute: 22 cells/uL (ref 0–200)
Basophils Relative: 0.3 %
Eosinophils Absolute: 73 cells/uL (ref 15–500)
Eosinophils Relative: 1 %
HCT: 37.8 % (ref 35.0–45.0)
Hemoglobin: 13 g/dL (ref 11.7–15.5)
Lymphs Abs: 1292 cells/uL (ref 850–3900)
MCH: 29.8 pg (ref 27.0–33.0)
MCHC: 34.4 g/dL (ref 32.0–36.0)
MCV: 86.7 fL (ref 80.0–100.0)
MPV: 9.9 fL (ref 7.5–12.5)
Monocytes Relative: 6.6 %
Neutro Abs: 5431 cells/uL (ref 1500–7800)
Neutrophils Relative %: 74.4 %
Platelets: 203 10*3/uL (ref 140–400)
RDW: 12.4 % (ref 11.0–15.0)
RPR Ser Ql: NONREACTIVE
Total Lymphocyte: 17.7 %
WBC: 7.3 10*3/uL (ref 3.8–10.8)

## 2020-08-04 LAB — HIV ANTIBODY (ROUTINE TESTING W REFLEX): HIV 1&2 Ab, 4th Generation: NONREACTIVE

## 2020-08-05 LAB — CYTOLOGY - PAP
Chlamydia: NEGATIVE
Comment: NEGATIVE
Comment: NORMAL
Diagnosis: NEGATIVE
Neisseria Gonorrhea: NEGATIVE

## 2020-08-14 ENCOUNTER — Other Ambulatory Visit: Payer: Self-pay

## 2020-08-14 ENCOUNTER — Ambulatory Visit (INDEPENDENT_AMBULATORY_CARE_PROVIDER_SITE_OTHER): Payer: Medicaid Other | Admitting: *Deleted

## 2020-08-14 DIAGNOSIS — Z3A12 12 weeks gestation of pregnancy: Secondary | ICD-10-CM

## 2020-08-14 DIAGNOSIS — Z3480 Encounter for supervision of other normal pregnancy, unspecified trimester: Secondary | ICD-10-CM

## 2020-08-14 DIAGNOSIS — Z3481 Encounter for supervision of other normal pregnancy, first trimester: Secondary | ICD-10-CM

## 2020-08-14 NOTE — Progress Notes (Signed)
Pt her for NIPS lab only

## 2020-08-25 DIAGNOSIS — Z3401 Encounter for supervision of normal first pregnancy, first trimester: Secondary | ICD-10-CM

## 2020-08-26 ENCOUNTER — Ambulatory Visit (INDEPENDENT_AMBULATORY_CARE_PROVIDER_SITE_OTHER): Payer: Medicaid Other | Admitting: Certified Nurse Midwife

## 2020-08-26 ENCOUNTER — Other Ambulatory Visit: Payer: Self-pay

## 2020-08-26 VITALS — BP 103/69 | HR 88 | Wt 153.0 lb

## 2020-08-26 DIAGNOSIS — Z3480 Encounter for supervision of other normal pregnancy, unspecified trimester: Secondary | ICD-10-CM

## 2020-08-26 DIAGNOSIS — Z3401 Encounter for supervision of normal first pregnancy, first trimester: Secondary | ICD-10-CM

## 2020-08-26 DIAGNOSIS — Z8632 Personal history of gestational diabetes: Secondary | ICD-10-CM

## 2020-08-26 NOTE — Patient Instructions (Signed)

## 2020-08-26 NOTE — Progress Notes (Signed)
Subjective:  Katherine Munoz is a 25 y.o. G3P1012 at [redacted]w[redacted]d being seen today for ongoing prenatal care.  She is currently monitored for the following issues for this low-risk pregnancy and has Supervision of low-risk first pregnancy, first trimester and History of gestational diabetes on their problem list.  Patient reports no complaints.  Contractions: Not present. Vag. Bleeding: None.  Movement: Absent. Denies leaking of fluid.   The following portions of the patient's history were reviewed and updated as appropriate: allergies, current medications, past family history, past medical history, past social history, past surgical history and problem list. Problem list updated.  Objective:   Vitals:   08/26/20 0932  BP: 103/69  Pulse: 88  Weight: 153 lb (69.4 kg)    Fetal Status: Fetal Heart Rate (bpm): 147   Movement: Absent     General:  Alert, oriented and cooperative. Patient is in no acute distress.  Skin: Skin is warm and dry. No rash noted.   Cardiovascular: Normal heart rate noted  Respiratory: Normal respiratory effort, no problems with respiration noted  Abdomen: Soft, gravid, appropriate for gestational age. Pain/Pressure: Absent     Pelvic: Vag. Bleeding: None Vag D/C Character: Thin   Cervical exam deferred        Extremities: Normal range of motion.  Edema: None  Mental Status: Normal mood and affect. Normal behavior. Normal judgment and thought content.   Urinalysis:      Assessment and Plan:  Pregnancy: G3P1012 at [redacted]w[redacted]d  1. Supervision of other normal pregnancy, antepartum - anatomy US scheduled - ROB at 20 wks  2. History of gestational diabetes - early GTT at 16 wks - start bASA  Preterm labor symptoms and general obstetric precautions including but not limited to vaginal bleeding, contractions, leaking of fluid and fetal movement were reviewed in detail with the patient. Please refer to After Visit Summary for other counseling recommendations.  Return in about  3 weeks (around 09/16/2020).   Donette Larry, CNM

## 2020-08-29 LAB — BILE ACIDS, TOTAL: Bile Acids Total: 12 umol/L (ref 0–19)

## 2020-09-12 ENCOUNTER — Ambulatory Visit (INDEPENDENT_AMBULATORY_CARE_PROVIDER_SITE_OTHER): Payer: Medicaid Other | Admitting: *Deleted

## 2020-09-12 ENCOUNTER — Other Ambulatory Visit: Payer: Self-pay

## 2020-09-12 DIAGNOSIS — Z3480 Encounter for supervision of other normal pregnancy, unspecified trimester: Secondary | ICD-10-CM

## 2020-09-12 DIAGNOSIS — O09299 Supervision of pregnancy with other poor reproductive or obstetric history, unspecified trimester: Secondary | ICD-10-CM

## 2020-09-12 DIAGNOSIS — Z8632 Personal history of gestational diabetes: Secondary | ICD-10-CM

## 2020-09-12 NOTE — Progress Notes (Signed)
Pt here for early GTT only 

## 2020-09-13 LAB — 2HR GTT W 1 HR, CARPENTER, 75 G
Glucose, 1 Hr, Gest: 132 mg/dL (ref 65–179)
Glucose, 2 Hr, Gest: 112 mg/dL (ref 65–152)
Glucose, Fasting, Gest: 72 mg/dL (ref 65–91)

## 2020-09-15 LAB — ALPHA FETOPROTEIN, MATERNAL
AFP MoM: 0.52
AFP, Serum: 17.5 ng/mL
Calc'd Gestational Age: 16.1 weeks
Maternal Wt: 154 [lb_av]
Risk for ONTD: <1
Twins-AFP: 1

## 2020-10-02 ENCOUNTER — Ambulatory Visit: Payer: Medicaid Other | Admitting: *Deleted

## 2020-10-02 ENCOUNTER — Other Ambulatory Visit: Payer: Self-pay

## 2020-10-02 ENCOUNTER — Ambulatory Visit: Payer: Medicaid Other | Attending: Obstetrics and Gynecology

## 2020-10-02 ENCOUNTER — Encounter: Payer: Self-pay | Admitting: *Deleted

## 2020-10-02 DIAGNOSIS — Z3689 Encounter for other specified antenatal screening: Secondary | ICD-10-CM

## 2020-10-02 DIAGNOSIS — Z3401 Encounter for supervision of normal first pregnancy, first trimester: Secondary | ICD-10-CM

## 2020-10-02 DIAGNOSIS — Z8632 Personal history of gestational diabetes: Secondary | ICD-10-CM | POA: Insufficient documentation

## 2020-10-02 DIAGNOSIS — Z3A18 18 weeks gestation of pregnancy: Secondary | ICD-10-CM | POA: Diagnosis not present

## 2020-10-02 DIAGNOSIS — O09292 Supervision of pregnancy with other poor reproductive or obstetric history, second trimester: Secondary | ICD-10-CM

## 2020-10-02 DIAGNOSIS — Z3480 Encounter for supervision of other normal pregnancy, unspecified trimester: Secondary | ICD-10-CM | POA: Insufficient documentation

## 2020-10-07 ENCOUNTER — Ambulatory Visit (INDEPENDENT_AMBULATORY_CARE_PROVIDER_SITE_OTHER): Payer: Medicaid Other | Admitting: Advanced Practice Midwife

## 2020-10-07 ENCOUNTER — Other Ambulatory Visit: Payer: Self-pay

## 2020-10-07 ENCOUNTER — Other Ambulatory Visit: Payer: Self-pay | Admitting: *Deleted

## 2020-10-07 VITALS — BP 110/67 | HR 85 | Wt 156.0 lb

## 2020-10-07 DIAGNOSIS — Z8632 Personal history of gestational diabetes: Secondary | ICD-10-CM

## 2020-10-07 DIAGNOSIS — O26892 Other specified pregnancy related conditions, second trimester: Secondary | ICD-10-CM | POA: Diagnosis not present

## 2020-10-07 DIAGNOSIS — Z3A19 19 weeks gestation of pregnancy: Secondary | ICD-10-CM

## 2020-10-07 DIAGNOSIS — Z8759 Personal history of other complications of pregnancy, childbirth and the puerperium: Secondary | ICD-10-CM

## 2020-10-07 DIAGNOSIS — O09292 Supervision of pregnancy with other poor reproductive or obstetric history, second trimester: Secondary | ICD-10-CM | POA: Diagnosis not present

## 2020-10-07 DIAGNOSIS — R102 Pelvic and perineal pain: Secondary | ICD-10-CM

## 2020-10-07 DIAGNOSIS — O09299 Supervision of pregnancy with other poor reproductive or obstetric history, unspecified trimester: Secondary | ICD-10-CM

## 2020-10-07 DIAGNOSIS — Z3480 Encounter for supervision of other normal pregnancy, unspecified trimester: Secondary | ICD-10-CM

## 2020-10-07 NOTE — Patient Instructions (Signed)
Abdominal Pain During Pregnancy Abdominal pain is common during pregnancy and has many possible causes. Some causes are more serious than others, and sometimes the cause is not known. Abdominal pain can be a sign that labor is starting. It can also be caused by normal growth of your baby causing stretching of muscles and ligaments during pregnancy. Always tell your health care provider if you have any abdominal pain. Follow these instructions at home:  Do not have sex or put anything in your vagina until your pain goes away completely.  Get plenty of rest until your pain improves.  Drink enough fluid to keep your urine pale yellow.  Take over-the-counter and prescription medicines only as told by your health care provider.  Keep all follow-up visits. This is important.   Contact a health care provider if:  Your pain continues or gets worse after resting.  You have lower abdominal pain that: ? Comes and goes at regular intervals. ? Spreads to your back. ? Is similar to menstrual cramps.  You have pain or burning when you urinate. Get help right away if:  You have a fever, chills, or shortness of breath.  You have vaginal bleeding.  You are leaking fluid or passing tissue from your vagina.  You have vomiting or diarrhea that lasts for more than 24 hours.  Your baby is moving less than usual.  You feel very weak or faint.  You develop severe pain in your upper abdomen. Summary  Abdominal pain is common during pregnancy and has many possible causes.  If you experience abdominal pain during pregnancy, tell your health care provider right away.  Follow your health care provider's home care instructions and keep all follow-up visits as told. This information is not intended to replace advice given to you by your health care provider. Make sure you discuss any questions you have with your health care provider. Document Revised: 01/08/2020 Document Reviewed: 01/08/2020 Elsevier  Patient Education  2021 Elsevier Inc.  

## 2020-10-07 NOTE — Progress Notes (Signed)
   PRENATAL VISIT NOTE  Subjective:  Katherine Munoz is a 25 y.o. G3P1012 at [redacted]w[redacted]d being seen today for ongoing prenatal care.  She is currently monitored for the following issues for this low-risk pregnancy and has Supervision of low-risk first pregnancy, first trimester and History of gestational diabetes on their problem list.  Patient reports mild pelvic pressure.  Contractions: Not present. Vag. Bleeding: None.  Movement: Present. Denies leaking of fluid.   The following portions of the patient's history were reviewed and updated as appropriate: allergies, current medications, past family history, past medical history, past social history, past surgical history and problem list.   Objective:   Vitals:   10/07/20 0934  BP: 110/67  Pulse: 85  Weight: 156 lb (70.8 kg)    Fetal Status: Fetal Heart Rate (bpm): 143   Movement: Present     General:  Alert, oriented and cooperative. Patient is in no acute distress.  Skin: Skin is warm and dry. No rash noted.   Cardiovascular: Normal heart rate noted  Respiratory: Normal respiratory effort, no problems with respiration noted  Abdomen: Soft, gravid, appropriate for gestational age.  Pain/Pressure: Present     Pelvic: Cervical exam deferred        Extremities: Normal range of motion.  Edema: None  Mental Status: Normal mood and affect. Normal behavior. Normal judgment and thought content.   Assessment and Plan:  Pregnancy: G3P1012 at [redacted]w[redacted]d 1. Supervision of other normal pregnancy, antepartum --Anticipatory guidance about next visits/weeks of pregnancy given. --  2. [redacted] weeks gestation of pregnancy   3. History of gestational diabetes in prior pregnancy, currently pregnant --Normal 2 hour GTT at 16 weeks  4. Pelvic pain affecting pregnancy in second trimester, antepartum --Mild pressure in pelvis, irregular, unable to time, worse with standing.  Discussed leaving urine sample today, pt reports she has UTIs frequently but this does  not feel like UTI.  Can leave sample in office anytime if symptoms change. --Rest/ice/heat/warm bath/increase PO fluids/Tylenol/pregnancy support belt   Preterm labor symptoms and general obstetric precautions including but not limited to vaginal bleeding, contractions, leaking of fluid and fetal movement were reviewed in detail with the patient. Please refer to After Visit Summary for other counseling recommendations.   Return in about 4 weeks (around 11/04/2020).  Future Appointments  Date Time Provider Department Center  11/04/2020  8:30 AM Rasch, Harolyn Rutherford, NP [facility]  12/25/2020 10:45 AM [facility] NURSE [facility]  12/25/2020 11:00 AM [facility] US1 [facility]    Sharen Counter, CNM

## 2020-11-04 ENCOUNTER — Inpatient Hospital Stay (HOSPITAL_COMMUNITY)
Admission: AD | Admit: 2020-11-04 | Discharge: 2020-11-05 | Disposition: A | Discharge status: Home / Self Care | Payer: Medicaid Other | Attending: Obstetrics and Gynecology | Admitting: Obstetrics and Gynecology

## 2020-11-04 ENCOUNTER — Ambulatory Visit (INDEPENDENT_AMBULATORY_CARE_PROVIDER_SITE_OTHER): Payer: Medicaid Other | Admitting: Obstetrics and Gynecology

## 2020-11-04 ENCOUNTER — Other Ambulatory Visit: Payer: Self-pay

## 2020-11-04 ENCOUNTER — Encounter (HOSPITAL_COMMUNITY): Payer: Self-pay | Admitting: Obstetrics and Gynecology

## 2020-11-04 DIAGNOSIS — O26892 Other specified pregnancy related conditions, second trimester: Secondary | ICD-10-CM

## 2020-11-04 DIAGNOSIS — Z7982 Long term (current) use of aspirin: Secondary | ICD-10-CM | POA: Insufficient documentation

## 2020-11-04 DIAGNOSIS — O4692 Antepartum hemorrhage, unspecified, second trimester: Secondary | ICD-10-CM

## 2020-11-04 DIAGNOSIS — N858 Other specified noninflammatory disorders of uterus: Secondary | ICD-10-CM

## 2020-11-04 DIAGNOSIS — Z3401 Encounter for supervision of normal first pregnancy, first trimester: Secondary | ICD-10-CM

## 2020-11-04 DIAGNOSIS — O26899 Other specified pregnancy related conditions, unspecified trimester: Secondary | ICD-10-CM

## 2020-11-04 DIAGNOSIS — R102 Pelvic and perineal pain: Secondary | ICD-10-CM

## 2020-11-04 DIAGNOSIS — Z3A23 23 weeks gestation of pregnancy: Secondary | ICD-10-CM

## 2020-11-04 LAB — URINALYSIS, ROUTINE W REFLEX MICROSCOPIC
Bilirubin Urine: NEGATIVE
Glucose, UA: NEGATIVE mg/dL
Ketones, ur: NEGATIVE mg/dL
Nitrite: NEGATIVE
Protein, ur: NEGATIVE mg/dL
Specific Gravity, Urine: 1.011 (ref 1.005–1.030)
pH: 7 (ref 5.0–8.0)

## 2020-11-04 MED ORDER — IBUPROFEN 600 MG PO TABS
600.0000 mg | ORAL_TABLET | Freq: Once | ORAL | Status: AC
  Administered 2020-11-05: 600 mg via ORAL
  Filled 2020-11-04: qty 1

## 2020-11-04 NOTE — Progress Notes (Signed)
   PRENATAL VISIT NOTE  Subjective:  Katherine Munoz is a 25 y.o. G3P1012 at [redacted]w[redacted]d being seen today for ongoing prenatal care.  She is currently monitored for the following issues for this low-risk pregnancy and has Supervision of low-risk first pregnancy, first trimester and History of gestational diabetes on their problem list.  Patient reports no complaints.  Contractions: Not present. Vag. Bleeding: None.  Movement: Present. Denies leaking of fluid.   The following portions of the patient's history were reviewed and updated as appropriate: allergies, current medications, past family history, past medical history, past social history, past surgical history and problem list.   Objective:   Vitals:   11/04/20 0828  BP: 107/67  Pulse: 83  Weight: 161 lb (73 kg)    Fetal Status: Fetal Heart Rate (bpm): 136 Fundal Height: 26 cm Movement: Present     General:  Alert, oriented and cooperative. Patient is in no acute distress.  Skin: Skin is warm and dry. No rash noted.   Cardiovascular: Normal heart rate noted  Respiratory: Normal respiratory effort, no problems with respiration noted  Abdomen: Soft, gravid, appropriate for gestational age.  Pain/Pressure: Absent     Pelvic: Cervical exam deferred        Extremities: Normal range of motion.  Edema: None  Mental Status: Normal mood and affect. Normal behavior. Normal judgment and thought content.   Assessment and Plan:  Pregnancy: G3P1012 at [redacted]w[redacted]d  1. Supervision of low-risk first pregnancy, first trimester  Doing well Passed early GTT Growth [place] in August.  Reviewed [facility] baby website.    Preterm labor symptoms and general obstetric precautions including but not limited to vaginal bleeding, contractions, leaking of fluid and fetal movement were reviewed in detail with the patient. Please refer to After Visit Summary for other counseling recommendations.   Return in about 4 weeks (around 12/02/2020), or For 2 hour  GTT.  Future Appointments  Date Time Provider Department Center  12/02/2020  9:10 AM Kendell Bane [facility]  12/25/2020 10:45 AM [facility] NURSE [facility]  12/25/2020 11:00 AM [facility] US1 [facility]    Venia Carbon, NP

## 2020-11-04 NOTE — MAU Provider Note (Signed)
Chief Complaint:  Vaginal Bleeding and Abdominal Pain   Event Date/Time   First Provider Initiated Contact with Patient 11/04/20 2329     HPI: Katherine Munoz is a 25 y.o. G3P1012 at 31w5dwho presents to maternity admissions reporting mild cramping and spotting today.  Bleeding got more red tonight.  Did have intercourse last night  .She reports good fetal movement, denies LOF, vaginal itching/burning, urinary symptoms, h/a, dizziness, n/v, diarrhea, constipation or fever/chills.  She denies headache, visual changes or RUQ abdominal pain.  Vaginal Bleeding The patient's primary symptoms include pelvic pain and vaginal bleeding. The patient's pertinent negatives include no genital itching, genital lesions or genital odor. This is a new problem. The current episode started today. The problem has been unchanged. The pain is mild. She is pregnant. Associated symptoms include abdominal pain. Pertinent negatives include no constipation, diarrhea, dysuria, fever, headaches or nausea. The vaginal discharge was bloody. The vaginal bleeding is spotting. She has not been passing clots. She has not been passing tissue. Nothing aggravates the symptoms. She has tried nothing for the symptoms. She is sexually active.  Abdominal Pain This is a new problem. The current episode started today. The onset quality is gradual. The problem occurs constantly. The problem has been unchanged. The pain is located in the LLQ, RLQ and suprapubic region. The pain is mild. The quality of the pain is cramping and aching. The abdominal pain does not radiate. Pertinent negatives include no constipation, diarrhea, dysuria, fever, headaches or nausea. Nothing aggravates the pain. The pain is relieved by Nothing. She has tried nothing for the symptoms.   RN Note: Katherine Munoz is a 25 y.o. at [redacted]w[redacted]d here in MAU reporting: Abdominal cramping and spotting that began around 10am.  +FM. Last intercourse: last night. +FM  Pain score: 3/10  Past  Medical History: Past Medical History:  Diagnosis Date   Cholestasis during pregnancy    IBS (irritable bowel syndrome)    Medical history non-contributory    Scoliosis     Past obstetric history: OB History  Gravida Para Term Preterm AB Living  3 1 1  0 1 2  SAB IAB Ectopic Multiple Live Births  1     1 1     # Outcome Date GA Lbr Len/2nd Weight Sex Delivery Anes PTL Lv  3 Current           2 SAB 05/2020 [redacted]w[redacted]d         1A Term 02/18/16 [redacted]w[redacted]d 07:10 / 01:17 2805 g M Vag-Spont EPI, Local  LIV  1B Term 02/18/16 [redacted]w[redacted]d 07:10 / 01:23 2670 g M Vag-Spont Local, EPI  LIV    Past Surgical History: Past Surgical History:  Procedure Laterality Date   TONSILLECTOMY     WISDOM TOOTH EXTRACTION      Family History: Family History  Problem Relation Age of Onset   Heart disease Maternal Grandfather    Breast cancer Other     Social History: Social History   Tobacco Use   Smoking status: Never   Smokeless tobacco: Never  Vaping Use   Vaping Use: Never used  Substance Use Topics   Alcohol use: Yes    Comment: Socially   Drug use: No    Allergies: No Known Allergies  Meds:  Medications Prior to Admission  Medication Sig Dispense Refill Last Dose   aspirin 81 MG chewable tablet Chew 1 tablet (81 mg total) by mouth daily. 90 tablet 1 11/04/2020   Prenatal Vit-Fe Fumarate-FA (PRENATAL 19 PO) Take  by mouth.   11/04/2020    I have reviewed patient's Past Medical Hx, Surgical Hx, Family Hx, Social Hx, medications and allergies.   ROS:  Review of Systems  Constitutional:  Negative for fever.  Gastrointestinal:  Positive for abdominal pain. Negative for constipation, diarrhea and nausea.  Genitourinary:  Positive for pelvic pain and vaginal bleeding. Negative for dysuria.  Neurological:  Negative for headaches.  Other systems negative  Physical Exam  Patient Vitals for the past 24 hrs:  BP Temp Temp src Pulse Resp SpO2 Height Weight  11/04/20 2244 124/71 98 F (36.7 C) Oral 87  17 100 % 5\' 1"  (1.549 m) 73.3 kg   Constitutional: Well-developed, well-nourished female in no acute distress.  Cardiovascular: normal rate and rhythm Respiratory: normal effort, clear to auscultation bilaterally GI: Abd soft, non-tender, gravid appropriate for gestational age.   No rebound or guarding. MS: Extremities nontender, no edema, normal ROM Neurologic: Alert and oriented x 4.  GU: Neg CVAT.  PELVIC EXAM: Cervix pink, visually closed, without lesion, scant dark red discharge, Adherent to cervix  No active flow from os.  No lesions on cervix Cervix is long and closed.   FHT:  Baseline 135 , moderate variability, accelerations present, no decelerations Contractions: Uterine irritability   Labs: Results for orders placed or performed during the hospital encounter of 11/04/20 (from the past 24 hour(s))  Urinalysis, Routine w reflex microscopic Urine, Clean Catch     Status: Abnormal   Collection Time: 11/04/20 10:47 PM  Result Value Ref Range   Color, Urine YELLOW YELLOW   APPearance HAZY (A) CLEAR   Specific Gravity, Urine 1.011 1.005 - 1.030   pH 7.0 5.0 - 8.0   Glucose, UA NEGATIVE NEGATIVE mg/dL   Hgb urine dipstick MODERATE (A) NEGATIVE   Bilirubin Urine NEGATIVE NEGATIVE   Ketones, ur NEGATIVE NEGATIVE mg/dL   Protein, ur NEGATIVE NEGATIVE mg/dL   Nitrite NEGATIVE NEGATIVE   Leukocytes,Ua TRACE (A) NEGATIVE   RBC / HPF 0-5 0 - 5 RBC/hpf   WBC, UA 0-5 0 - 5 WBC/hpf   Bacteria, UA RARE (A) NONE SEEN   Squamous Epithelial / LPF 6-10 0 - 5   Mucus PRESENT    Amorphous Crystal PRESENT    O/RH(D) POSITIVE/-- (03/23 0000)  Imaging:  No results found.  MAU Course/MDM: I have ordered labs and reviewed results. UA is negative.  NST reviewed, reassuring for gestational age  Treatments in MAU included ibuprofen which stopped pelvic cramping.  No further bleeding noted.  Discussed pelvic rest for 1-2 wks.  .    Assessment: Single IUP at [redacted]w[redacted]d Uterine  irritability Small vaginal bleeding, ? Post-coital  Plan: Discharge home Preterm Labor precautions and fetal kick counts Bleeding precautions Pelvic rest Follow up in Office for prenatal visits Will have office check on her in a week   Pt stable at time of discharge.  [redacted]w[redacted]d CNM, MSN Certified Nurse-Midwife 11/04/2020 11:29 PM

## 2020-11-04 NOTE — MAU Note (Signed)
..  Katherine Munoz is a 25 y.o. at [redacted]w[redacted]d here in MAU reporting: Abdominal cramping and spotting that began around 10am.  +FM. Last intercourse: last night. +FM  Pain score: 3/10 Vitals:   11/04/20 2244  BP: 124/71  Pulse: 87  Resp: 17  Temp: 98 F (36.7 C)  SpO2: 100%      Lab orders placed from triage: UA

## 2020-11-05 DIAGNOSIS — Z3A23 23 weeks gestation of pregnancy: Secondary | ICD-10-CM | POA: Diagnosis not present

## 2020-11-05 DIAGNOSIS — R102 Pelvic and perineal pain: Secondary | ICD-10-CM | POA: Diagnosis not present

## 2020-11-05 DIAGNOSIS — O26892 Other specified pregnancy related conditions, second trimester: Secondary | ICD-10-CM | POA: Diagnosis not present

## 2020-11-05 DIAGNOSIS — N858 Other specified noninflammatory disorders of uterus: Secondary | ICD-10-CM | POA: Diagnosis not present

## 2020-11-05 DIAGNOSIS — Z7982 Long term (current) use of aspirin: Secondary | ICD-10-CM | POA: Diagnosis not present

## 2020-11-05 DIAGNOSIS — O4692 Antepartum hemorrhage, unspecified, second trimester: Secondary | ICD-10-CM | POA: Diagnosis present

## 2020-11-11 ENCOUNTER — Other Ambulatory Visit: Payer: Self-pay

## 2020-11-11 ENCOUNTER — Ambulatory Visit (INDEPENDENT_AMBULATORY_CARE_PROVIDER_SITE_OTHER): Payer: Medicaid Other | Admitting: Nurse Practitioner

## 2020-11-11 VITALS — BP 116/75 | HR 106 | Wt 163.0 lb

## 2020-11-11 DIAGNOSIS — Z3A24 24 weeks gestation of pregnancy: Secondary | ICD-10-CM

## 2020-11-11 DIAGNOSIS — Z8742 Personal history of other diseases of the female genital tract: Secondary | ICD-10-CM

## 2020-11-11 DIAGNOSIS — Z8632 Personal history of gestational diabetes: Secondary | ICD-10-CM

## 2020-11-11 DIAGNOSIS — Z3401 Encounter for supervision of normal first pregnancy, first trimester: Secondary | ICD-10-CM

## 2020-11-11 NOTE — Progress Notes (Signed)
    Subjective:  Katherine Munoz is a 25 y.o. G3P1012 at [redacted]w[redacted]d being seen today for ongoing prenatal care.  She is currently monitored for the following issues for this low-risk pregnancy and has Supervision of low-risk first pregnancy, first trimester; History of gestational diabetes; and Lactose intolerance in adult on their problem list.  Patient reports no complaints.  No bleeding.  Contractions: Not present. Vag. Bleeding: None.  Movement: Present. Denies leaking of fluid.   The following portions of the patient's history were reviewed and updated as appropriate: allergies, current medications, past family history, past medical history, past social history, past surgical history and problem list. Problem list updated.  Objective:   Vitals:   11/11/20 0938  BP: 116/75  Pulse: (!) 106  Weight: 163 lb (73.9 kg)    Fetal Status: Fetal Heart Rate (bpm): 154 Fundal Height: 27 cm Movement: Present     General:  Alert, oriented and cooperative. Patient is in no acute distress.  Skin: Skin is warm and dry. No rash noted.   Cardiovascular: Normal heart rate noted  Respiratory: Normal respiratory effort, no problems with respiration noted  Abdomen: Soft, gravid, appropriate for gestational age. Pain/Pressure: Absent     Pelvic:  Cervical exam performed    Speculum exam only - no blood seen, one red spot seen on cervix at 1 o;clock - no bleeding when touched with a swab  Extremities: Normal range of motion.  Edema: None  Mental Status: Normal mood and affect. Normal behavior. Normal judgment and thought content.   Urinalysis:      Assessment and Plan:  Pregnancy: G3P1012 at [redacted]w[redacted]d  1. Supervision of low-risk first pregnancy, first trimester No blood seen on exam today Based on appearance of cervix, discussed pelvic rest but client reports she has intercourse daily and that would be a hardship.  Recommended gentle intercourse as cervix is more soft in pregnancy. Also she is very physically  active - had 4 mile walk this weekend.  Has had a 7 mile walk in the past couple of weeks and had right big toenail bruising.  Also rides stationary bike daily as she likes cardio.  2. History of gestational diabetes Reviewed fasting for testing in 4 weeks  3. History of vaginal bleeding No bleeding since initial episode    Preterm labor symptoms and general obstetric precautions including but not limited to vaginal bleeding, contractions, leaking of fluid and fetal movement were reviewed in detail with the patient. Please refer to After Visit Summary for other counseling recommendations.  Return in about 4 weeks (around 12/09/2020) for early AM appt for ROB and 2 hr glucola.  Nolene Bernheim, RN, MSN, NP-BC Nurse Practitioner, [facility] for [employer], [facility] 11/11/2020 10:16 AM

## 2020-12-02 ENCOUNTER — Other Ambulatory Visit: Payer: Self-pay

## 2020-12-02 ENCOUNTER — Ambulatory Visit (INDEPENDENT_AMBULATORY_CARE_PROVIDER_SITE_OTHER): Payer: Medicaid Other | Admitting: Obstetrics and Gynecology

## 2020-12-02 VITALS — BP 106/65 | HR 80 | Wt 165.0 lb

## 2020-12-02 DIAGNOSIS — Z23 Encounter for immunization: Secondary | ICD-10-CM

## 2020-12-02 DIAGNOSIS — Z3400 Encounter for supervision of normal first pregnancy, unspecified trimester: Secondary | ICD-10-CM

## 2020-12-02 DIAGNOSIS — Z3401 Encounter for supervision of normal first pregnancy, first trimester: Secondary | ICD-10-CM | POA: Diagnosis not present

## 2020-12-02 NOTE — Progress Notes (Signed)
   PRENATAL VISIT NOTE  Subjective:  Katherine Munoz is a 25 y.o. G3P1012 at [redacted]w[redacted]d being seen today for ongoing prenatal care.  She is currently monitored for the following issues for this low-risk pregnancy and has Supervision of low-risk first pregnancy, first trimester; History of gestational diabetes; and Lactose intolerance in adult on their problem list.  Patient reports  Contractions: Not present. Vag. Bleeding: None.  Movement: Present. Denies leaking of fluid.   The following portions of the patient's history were reviewed and updated as appropriate: allergies, current medications, past family history, past medical history, past social history, past surgical history and problem list.   Objective:   Vitals:   12/02/20 0856  BP: 106/65  Pulse: 80  Weight: 165 lb (74.8 kg)    Fetal Status: Fetal Heart Rate (bpm): 137   Movement: Present     General:  Alert, oriented and cooperative. Patient is in no acute distress.  Skin: Skin is warm and dry. No rash noted.   Cardiovascular: Normal heart rate noted  Respiratory: Normal respiratory effort, no problems with respiration noted  Abdomen: Soft, gravid, appropriate for gestational age.  Pain/Pressure: Absent     Pelvic: Cervical exam deferred        Extremities: Normal range of motion.  Edema: None  Mental Status: Normal mood and affect. Normal behavior. Normal judgment and thought content.   Assessment and Plan:  Pregnancy: G3P1012 at [redacted]w[redacted]d 1. Encounter for supervision of low-risk first pregnancy, antepartum  - 2Hr GTT w/ 1 Hr Carpenter 75 g - HIV antibody (with reflex) - CBC - RPR - Tdap vaccine greater than or equal to 7yo IM    Preterm labor symptoms and general obstetric precautions including but not limited to vaginal bleeding, contractions, leaking of fluid and fetal movement were reviewed in detail with the patient. Please refer to After Visit Summary for other counseling recommendations.   No follow-ups on  file.  Future Appointments  Date Time Provider Department Center  12/25/2020 10:45 AM [facility] NURSE [facility]  12/25/2020 11:00 AM [facility] US1 [facility]    Venia Carbon, NP

## 2020-12-03 ENCOUNTER — Encounter: Payer: Self-pay | Admitting: Obstetrics and Gynecology

## 2020-12-03 ENCOUNTER — Other Ambulatory Visit: Payer: Self-pay | Admitting: Obstetrics and Gynecology

## 2020-12-03 DIAGNOSIS — O24419 Gestational diabetes mellitus in pregnancy, unspecified control: Secondary | ICD-10-CM | POA: Insufficient documentation

## 2020-12-03 DIAGNOSIS — O24410 Gestational diabetes mellitus in pregnancy, diet controlled: Secondary | ICD-10-CM

## 2020-12-03 LAB — 2HR GTT W 1 HR, CARPENTER, 75 G
Glucose, 1 Hr, Gest: 158 mg/dL (ref 65–179)
Glucose, 2 Hr, Gest: 162 mg/dL — ABNORMAL HIGH (ref 65–152)
Glucose, Fasting, Gest: 78 mg/dL (ref 65–91)

## 2020-12-03 LAB — CBC
HCT: 33.7 % — ABNORMAL LOW (ref 35.0–45.0)
Hemoglobin: 11.2 g/dL — ABNORMAL LOW (ref 11.7–15.5)
MCH: 29.6 pg (ref 27.0–33.0)
MCHC: 33.2 g/dL (ref 32.0–36.0)
MCV: 88.9 fL (ref 80.0–100.0)
MPV: 9.9 fL (ref 7.5–12.5)
Platelets: 168 10*3/uL (ref 140–400)
RBC: 3.79 10*6/uL — ABNORMAL LOW (ref 3.80–5.10)
RDW: 12.6 % (ref 11.0–15.0)
WBC: 8.9 10*3/uL (ref 3.8–10.8)

## 2020-12-03 LAB — RPR: RPR Ser Ql: NONREACTIVE

## 2020-12-03 LAB — HIV ANTIBODY (ROUTINE TESTING W REFLEX): HIV 1&2 Ab, 4th Generation: NONREACTIVE

## 2020-12-16 ENCOUNTER — Encounter: Payer: Medicaid Other | Attending: Family Medicine | Admitting: Registered"

## 2020-12-16 ENCOUNTER — Other Ambulatory Visit: Payer: Self-pay | Admitting: *Deleted

## 2020-12-16 ENCOUNTER — Ambulatory Visit: Payer: Medicaid Other | Admitting: Registered"

## 2020-12-16 ENCOUNTER — Other Ambulatory Visit: Payer: Self-pay

## 2020-12-16 DIAGNOSIS — O24419 Gestational diabetes mellitus in pregnancy, unspecified control: Secondary | ICD-10-CM

## 2020-12-16 MED ORDER — ACCU-CHEK SOFTCLIX LANCETS MISC: 1.0000 | Freq: Four times a day (QID) | 12 refills | Status: DC

## 2020-12-16 MED ORDER — ACCU-CHEK GUIDE VI STRP: ORAL_STRIP | 12 refills | Status: DC

## 2020-12-16 NOTE — Progress Notes (Signed)
Patient was seen for Gestational Diabetes self-management on 12/16/2020  Start time 0920 and End time 1020   Estimated due date: 02/26/2021; [redacted]w[redacted]d  Clinical: Medications: aspirin 81 mg/ prenatal Medical History: GDM 4 yrs ago Labs: OGTT elevated 2 hr, A1c 5.2%   Dietary and Lifestyle History: Pt states she has started changing diet to what she can remember from her GDM education 4 yrs ago.   Physical Activity: very physically active Stress: depends on the day, has twins  Sleep: 8-9 hours  24 hr Recall: First Meal: PB toast, banana, flaxseed, honey, coffee with oatmilk Snack:fruit bars Second meal: spinach salad OR pasta and large salad Snack:none Third meal: chicken, rice in whole wheat tortilla with sour cream cheese salsa Snack: maybe fruit but not sure if she should Beverages: water  NUTRITION INTERVENTION  Nutrition education (E-1) on the following topics:   Initial Follow-up  [x]  []  Definition of Gestational Diabetes [x]  []  Why dietary management is important in controlling blood glucose [x]  []  Effects each nutrient has on blood glucose levels [x]  []  Simple carbohydrates vs complex carbohydrates [x]  []  Fluid intake [x]  []  Creating a balanced meal plan [x]  []  Carbohydrate counting  [x]  []  When to check blood glucose levels [x]  []  Proper blood glucose monitoring techniques [x]  []  Effect of stress and stress reduction techniques  [x]  []  Exercise effect on blood glucose levels, appropriate exercise during pregnancy []  []  Importance of limiting caffeine and abstaining from alcohol and smoking []  []  Medications used for blood sugar control during pregnancy []  []  Hypoglycemia and rule of 15 [x]  []  Postpartum self care  Blood glucose monitor given: Accu-chek Guide Me Lot Exp: 09/12/2021 CBG: 136 mg/dL min after breakfast  Patient instructed to monitor glucose levels: FBS: 60 - ? 95 mg/dL (some clinics use 90 for cutoff) 1 hour: ? 140 mg/dL 2 hour: ?  mg/dL  Patient received handouts: Nutrition Diabetes and Pregnancy Carbohydrate Counting List  Patient will be seen for follow-up as needed.

## 2020-12-17 ENCOUNTER — Telehealth: Payer: Self-pay | Admitting: Registered"

## 2020-12-17 ENCOUNTER — Telehealth: Payer: Self-pay | Admitting: *Deleted

## 2020-12-17 NOTE — Telephone Encounter (Signed)
Pt has had some issues with her pharmacy getting her teting strips for her glucose meter.  STrips and lancets were sent in via Epic yesterday but the pharmacy stated they only got lancets.  Ph[facility] for Accu-Chek Guide me testing strips.  Disp #100 and 12 RF's.

## 2020-12-17 NOTE — Telephone Encounter (Signed)
Patient called because her pharmacy did not fill an order for Accu check strips. RD [facility] and they said they had 2 orders for lancets and no order for strips. [facility] also [facility] to [address] in [place]. RD sent Epic chat message to Mariel Aloe.

## 2020-12-23 ENCOUNTER — Other Ambulatory Visit: Payer: Self-pay

## 2020-12-23 ENCOUNTER — Ambulatory Visit (INDEPENDENT_AMBULATORY_CARE_PROVIDER_SITE_OTHER): Payer: Medicaid Other

## 2020-12-23 VITALS — BP 114/67 | HR 105 | Wt 168.0 lb

## 2020-12-23 DIAGNOSIS — R102 Pelvic and perineal pain: Secondary | ICD-10-CM

## 2020-12-23 DIAGNOSIS — Z3A30 30 weeks gestation of pregnancy: Secondary | ICD-10-CM

## 2020-12-23 DIAGNOSIS — O24419 Gestational diabetes mellitus in pregnancy, unspecified control: Secondary | ICD-10-CM

## 2020-12-23 DIAGNOSIS — O26893 Other specified pregnancy related conditions, third trimester: Secondary | ICD-10-CM

## 2020-12-23 NOTE — Progress Notes (Signed)
Pt c/o increase in pelvic pain

## 2020-12-23 NOTE — Progress Notes (Signed)
   PRENATAL VISIT NOTE  Subjective:  Katherine Munoz is a 25 y.o. G3P1012 at [redacted]w[redacted]d being seen today for ongoing prenatal care.  She is currently monitored for the following issues for this high-risk pregnancy and has Supervision of low-risk first pregnancy, first trimester; History of gestational diabetes; Lactose intolerance in adult; and Gestational diabetes on their problem list.  Patient reports increasing pelvic pressure, especially with activity. Resolves at rest. Using belly band prn.  Contractions: Not present. Vag. Bleeding: None.  Movement: Present. Denies leaking of fluid.   The following portions of the patient's history were reviewed and updated as appropriate: allergies, current medications, past family history, past medical history, past social history, past surgical history and problem list.   Objective:   Vitals:   12/23/20 0915  BP: 114/67  Pulse: (!) 105  Weight: 168 lb (76.2 kg)    Fetal Status: Fetal Heart Rate (bpm): 134 Fundal Height: 31 cm Movement: Present     General:  Alert, oriented and cooperative. Patient is in no acute distress.  Skin: Skin is warm and dry. No rash noted.   Cardiovascular: Normal heart rate noted  Respiratory: Normal respiratory effort, no problems with respiration noted  Abdomen: Soft, gravid, appropriate for gestational age.  Pain/Pressure: Present     Pelvic: Cervical exam deferred        Extremities: Normal range of motion.  Edema: None  Mental Status: Normal mood and affect. Normal behavior. Normal judgment and thought content.   Assessment and Plan:  Pregnancy: G3P1012 at [redacted]w[redacted]d  1. Gestational diabetes mellitus (GDM), antepartum, gestational diabetes method of control unspecified - S/p diabetes education - Did not bring log today. Reports overall fasting and 2hr postprandials have been within range with the exception of 1 day where she had 1 whole banana for breakfast - Encouraged patient to bring log to next visit  2. [redacted] weeks  gestation of pregnancy - Routine OB care  3. Pelvic pain affecting pregnancy in third trimester, antepartum - Reasurrance provided - Encouraged regular use of support belt, stretching, Tylenol, heat/ice prn as well as PO hydration - May continue to exercise as long as patient is pain free, not contracting or having vaginal bleeding    Preterm labor symptoms and general obstetric precautions including but not limited to vaginal bleeding, contractions, leaking of fluid and fetal movement were reviewed in detail with the patient. Please refer to After Visit Summary for other counseling recommendations.   Return in about 2 weeks (around 01/06/2021).  Future Appointments  Date Time Provider Department Center  12/25/2020 10:45 AM [facility] NURSE [facility]  12/25/2020 11:00 AM [facility] US1 [facility]  01/06/2021  8:30 AM Leftwich-Kirby, Wilmer Floor, CNM [facility]     Brand Males, CNM 12/23/20 10:59 AM

## 2020-12-25 ENCOUNTER — Ambulatory Visit: Payer: Medicaid Other | Attending: Obstetrics

## 2020-12-25 ENCOUNTER — Encounter: Payer: Self-pay | Admitting: *Deleted

## 2020-12-25 ENCOUNTER — Other Ambulatory Visit: Payer: Self-pay

## 2020-12-25 ENCOUNTER — Ambulatory Visit: Payer: Medicaid Other | Admitting: *Deleted

## 2020-12-25 ENCOUNTER — Ambulatory Visit: Payer: Medicaid Other | Attending: Obstetrics | Admitting: Obstetrics

## 2020-12-25 VITALS — BP 113/68 | HR 88

## 2020-12-25 DIAGNOSIS — Z8759 Personal history of other complications of pregnancy, childbirth and the puerperium: Secondary | ICD-10-CM | POA: Diagnosis not present

## 2020-12-25 DIAGNOSIS — O24410 Gestational diabetes mellitus in pregnancy, diet controlled: Secondary | ICD-10-CM

## 2020-12-25 DIAGNOSIS — Z3A30 30 weeks gestation of pregnancy: Secondary | ICD-10-CM

## 2020-12-25 DIAGNOSIS — Z363 Encounter for antenatal screening for malformations: Secondary | ICD-10-CM

## 2020-12-25 DIAGNOSIS — Z3A31 31 weeks gestation of pregnancy: Secondary | ICD-10-CM

## 2020-12-25 DIAGNOSIS — Z8632 Personal history of gestational diabetes: Secondary | ICD-10-CM | POA: Insufficient documentation

## 2020-12-25 DIAGNOSIS — Z3401 Encounter for supervision of normal first pregnancy, first trimester: Secondary | ICD-10-CM

## 2020-12-25 NOTE — Progress Notes (Signed)
MFM Note  Katherine Munoz was seen for a follow up growth scan due to diet controlled gestational diabetes.  She denies any other problems since Katherine Munoz last exam and is performing daily fingersticks.  She was informed that the fetal growth and amniotic fluid level appears appropriate for Katherine Munoz gestational age.  The implications and management of diabetes in pregnancy was discussed in detail with the patient. She was advised that our goals for Katherine Munoz fingerstick values are fasting values of 90-95 or less and two-hour postprandials of 120 or less.  Should Katherine Munoz fingerstick values be above these values, she may have to be started on insulin or metformin to help Katherine Munoz achieve better glycemic control. The patient was advised that getting Katherine Munoz fingerstick values as close to these goals as possible would provide Katherine Munoz with the most optimal obstetrical outcome.  A follow up exam was scheduled in 4 weeks.   Weekly fetal testing should be started at 32 weeks should she require insulin or metformin for treatment of gestational diabetes.  A total of 15 minutes was spent counseling and coordinating the care for this patient.  Greater than 50% of the time was spent in direct face-to-face contact.

## 2020-12-26 ENCOUNTER — Other Ambulatory Visit: Payer: Self-pay | Admitting: *Deleted

## 2020-12-26 DIAGNOSIS — O24410 Gestational diabetes mellitus in pregnancy, diet controlled: Secondary | ICD-10-CM

## 2020-12-30 ENCOUNTER — Ambulatory Visit (INDEPENDENT_AMBULATORY_CARE_PROVIDER_SITE_OTHER): Payer: Medicaid Other | Admitting: Advanced Practice Midwife

## 2020-12-30 ENCOUNTER — Other Ambulatory Visit: Payer: Self-pay

## 2020-12-30 VITALS — BP 109/71 | HR 100 | Wt 168.0 lb

## 2020-12-30 DIAGNOSIS — Z3483 Encounter for supervision of other normal pregnancy, third trimester: Secondary | ICD-10-CM

## 2020-12-30 DIAGNOSIS — O24410 Gestational diabetes mellitus in pregnancy, diet controlled: Secondary | ICD-10-CM

## 2020-12-30 DIAGNOSIS — Z3A31 31 weeks gestation of pregnancy: Secondary | ICD-10-CM

## 2020-12-30 DIAGNOSIS — L299 Pruritus, unspecified: Secondary | ICD-10-CM

## 2020-12-30 MED ORDER — HYDROXYZINE PAMOATE 25 MG PO CAPS
25.0000 mg | ORAL_CAPSULE | Freq: Three times a day (TID) | ORAL | 2 refills | Status: DC | PRN
Start: 2020-12-30 — End: 2021-03-09

## 2020-12-30 NOTE — Progress Notes (Signed)
Pt c/o itching on stomach, legs, arms and chest.

## 2020-12-30 NOTE — Progress Notes (Signed)
Pt having itching and wants to be tested for Cholestasis.

## 2020-12-30 NOTE — Progress Notes (Signed)
PRENATAL VISIT NOTE  Subjective:  Katherine Munoz is a 25 y.o. G3P1012 at [redacted]w[redacted]d being seen today for ongoing prenatal care.  She is currently monitored for the following issues for this low-risk pregnancy and has Supervision of low-risk first pregnancy, first trimester; History of gestational diabetes; Lactose intolerance in adult; and Gestational diabetes on their problem list.  Patient reports  itching .  Contractions: Not present. Vag. Bleeding: None.  Movement: Present. Denies leaking of fluid.   The following portions of the patient's history were reviewed and updated as appropriate: allergies, current medications, past family history, past medical history, past social history, past surgical history and problem list.   Objective:   Vitals:   12/30/20 1350  BP: 109/71  Pulse: 100  Weight: 168 lb (76.2 kg)    Fetal Status: Fetal Heart Rate (bpm): 132   Movement: Present     General:  Alert, oriented and cooperative. Patient is in no acute distress.  Skin: Skin is warm and dry. No rash noted.   Cardiovascular: Normal heart rate noted  Respiratory: Normal respiratory effort, no problems with respiration noted  Abdomen: Soft, gravid, appropriate for gestational age.  Pain/Pressure: Absent     Pelvic: Cervical exam deferred        Extremities: Normal range of motion.  Edema: None  Mental Status: Normal mood and affect. Normal behavior. Normal judgment and thought content.   Assessment and Plan:  Pregnancy: G3P1012 at [redacted]w[redacted]d 1. Itching --Pt with cholestasis last pregnancy. Itching is generalized with no visible rash, similar to last pregnancy but less severe than last time. -Fetal kick counts/warning signs/reasons to seek care reviewed   - Bile acids, total - Comp Met (CMET) - hydrOXYzine (VISTARIL) 25 MG capsule; Take 1 capsule (25 mg total) by mouth 3 (three) times daily as needed.  Dispense: 30 capsule; Refill: 2  2. Diet controlled gestational diabetes mellitus (GDM) in  third trimester --Reviewed glucose log.  All fasting values below 95, 2-3 out of 11 over 90.  Pt is taking PP sometimes at 1 hour and sometimes at 2 hours.  Only 1-2 values out of 30 at 2 hours are out of range but 4-5 are elevated at 1 hour. --Discussed options, and pt has prenatal appt in 1 week, so plan for pt to take PP at 2 hours consistently and we will discuss at next visit. If still >20% out of range, will start Metformin.  3. [redacted] weeks gestation of pregnancy   4. Encounter for supervision of other normal pregnancy in third trimester --Anticipatory guidance about next visits/weeks of pregnancy given. --Next visit in 1 week as scheduled, will review glucose log again, see above   Preterm labor symptoms and general obstetric precautions including but not limited to vaginal bleeding, contractions, leaking of fluid and fetal movement were reviewed in detail with the patient. Please refer to After Visit Summary for other counseling recommendations.   No follow-ups on file.  Future Appointments  Date Time Provider Department Center  01/06/2021  8:30 AM Leftwich-Kirby, Lisa A, CNM [facility]  01/26/2021 12:45 PM [facility] NURSE [facility]  01/26/2021  1:00 PM [facility] US1 [facility]    Lisa Leftwich-Kirby, CNM  

## 2020-12-31 LAB — COMPREHENSIVE METABOLIC PANEL
AG Ratio: 1.4 (calc) (ref 1.0–2.5)
ALT: 7 U/L (ref 6–29)
AST: 12 U/L (ref 10–30)
Albumin: 3.7 g/dL (ref 3.6–5.1)
Alkaline phosphatase (APISO): 74 U/L (ref 31–125)
BUN: 7 mg/dL (ref 7–25)
CO2: 23 mmol/L (ref 20–32)
Calcium: 8.8 mg/dL (ref 8.6–10.2)
Chloride: 104 mmol/L (ref 98–110)
Creat: 0.56 mg/dL (ref 0.50–0.96)
Globulin: 2.7 g/dL (calc) (ref 1.9–3.7)
Glucose, Bld: 60 mg/dL — ABNORMAL LOW (ref 65–139)
Potassium: 4 mmol/L (ref 3.5–5.3)
Sodium: 135 mmol/L (ref 135–146)
Total Bilirubin: 0.3 mg/dL (ref 0.2–1.2)
Total Protein: 6.4 g/dL (ref 6.1–8.1)

## 2021-01-02 LAB — BILE ACIDS, TOTAL: Bile Acids Total: 11 umol/L (ref 0–19)

## 2021-01-04 MED ORDER — URSODIOL 300 MG PO CAPS: 300.0000 mg | ORAL_CAPSULE | Freq: Three times a day (TID) | ORAL | 0 refills | Status: DC

## 2021-01-04 NOTE — Addendum Note (Signed)
Addended by: Sharen Counter A on: 01/04/2021 06:19 PM   Modules accepted: Orders

## 2021-01-06 ENCOUNTER — Ambulatory Visit (INDEPENDENT_AMBULATORY_CARE_PROVIDER_SITE_OTHER): Payer: Medicaid Other | Admitting: Advanced Practice Midwife

## 2021-01-06 ENCOUNTER — Other Ambulatory Visit: Payer: Self-pay

## 2021-01-06 VITALS — BP 125/78 | HR 112 | Wt 169.0 lb

## 2021-01-06 DIAGNOSIS — O0993 Supervision of high risk pregnancy, unspecified, third trimester: Secondary | ICD-10-CM

## 2021-01-06 DIAGNOSIS — O26613 Liver and biliary tract disorders in pregnancy, third trimester: Secondary | ICD-10-CM | POA: Diagnosis not present

## 2021-01-06 DIAGNOSIS — Z3A32 32 weeks gestation of pregnancy: Secondary | ICD-10-CM

## 2021-01-06 DIAGNOSIS — O0990 Supervision of high risk pregnancy, unspecified, unspecified trimester: Secondary | ICD-10-CM

## 2021-01-06 DIAGNOSIS — O24410 Gestational diabetes mellitus in pregnancy, diet controlled: Secondary | ICD-10-CM

## 2021-01-06 DIAGNOSIS — K831 Obstruction of bile duct: Secondary | ICD-10-CM

## 2021-01-06 NOTE — Progress Notes (Signed)
   PRENATAL VISIT NOTE  Subjective:  Katherine Munoz is a 25 y.o. G3P1012 at [redacted]w[redacted]d being seen today for ongoing prenatal care.  She is currently monitored for the following issues for this high-risk pregnancy and has Supervision of low-risk first pregnancy, first trimester; History of gestational diabetes; Lactose intolerance in adult; and Gestational diabetes on their problem list.  Patient reports no complaints.  Contractions: Not present. Vag. Bleeding: None.  Movement: Present. Denies leaking of fluid.   The following portions of the patient's history were reviewed and updated as appropriate: allergies, current medications, past family history, past medical history, past social history, past surgical history and problem list.   Objective:   Vitals:   01/06/21 0838  BP: 125/78  Pulse: (!) 112  Weight: 169 lb (76.7 kg)    Fetal Status:     Movement: Present  Presentation: Vertex  General:  Alert, oriented and cooperative. Patient is in no acute distress.  Skin: Skin is warm and dry. No rash noted.   Cardiovascular: Normal heart rate noted  Respiratory: Normal respiratory effort, no problems with respiration noted  Abdomen: Soft, gravid, appropriate for gestational age.  Pain/Pressure: Absent     Pelvic: Cervical exam deferred        Extremities: Normal range of motion.  Edema: None  Mental Status: Normal mood and affect. Normal behavior. Normal judgment and thought content.   Assessment and Plan:  Pregnancy: G3P1012 at [redacted]w[redacted]d 1. Cholestasis --On Actigall and Vistaril, mild itching today --Good fetal movement --NST reactive, AFI wnl today, return for NST Friday  2. Diet controlled gestational diabetes mellitus (GDM) in third trimester --Reviewed glucose log.  1 out of 20 fasting values elevated at 99.  30 out of 60 PP values elevated, highest 166.  Discussed percentage out of range with pt.  --Rx for Metformin 500 mg BID to start --Continue dietary changes  3. Supervision of  high risk pregnancy, antepartum --Anticipatory guidance about next visits/weeks of pregnancy given. --Pt desires natural childbirth, freedom of movement, to avoid Pitocin if possible.  She will bring a written birth plan to future visit.  Discussed waterbirth but pt not a candidate due to cholestasis and need for continuous EFM.  Also, starting Metformin today is a contraindication to WB.  Pt states understanding. Reviewed options for low intervention birth, choice of positions for labor/birth with patient.  She desires midwife for delivery if possible.  Preterm labor symptoms and general obstetric precautions including but not limited to vaginal bleeding, contractions, leaking of fluid and fetal movement were reviewed in detail with the patient. Please refer to After Visit Summary for other counseling recommendations.   Return in about 2 weeks (around 01/20/2021).  Future Appointments  Date Time Provider Department Center  01/09/2021  8:30 AM [facility] NURSE [facility]  01/13/2021  8:30 AM [facility]URSE [facility]  01/16/2021  8:45 AM [facility]UR[facility]  01/20/2021  9:30 AM Leftwich-Kirby, Wilmer Floor, CNM [facility]  01/26/2021 12:45 PM [facility]SE [facility]  01/26/2021  1:00 PM [facility] US1 [facility]    Sharen Counter, CNM

## 2021-01-06 NOTE — Patient Instructions (Signed)
Things to Try After 37 weeks to Encourage Labor/Get Ready for Labor:    Try the [employer] Circuit at www.milescircuit.com daily to improve baby's position and encourage the onset of labor.  Walk a little and rest a little every day.  Change positions often.  Cervical Ripening: May try one or both Red Raspberry Leaf capsules or tea:  two 300mg or 400mg tablets with each meal, 2-3 times a day, or 1-3 cups of tea daily  Potential Side Effects Of Raspberry Leaf:  Most women do not experience any side effects from drinking raspberry leaf tea. However, nausea and loose stools are possible   Evening Primrose Oil capsules: take 1 capsule by mouth and place one capsule in the vagina every night.    Some of the potential side effects:  Upset stomach  Loose stools or diarrhea  Headaches  Nausea  Sex can also help the cervix ripen and encourage labor onset.    Reasons to return to MAU at [facility]:  Since you are preterm, return to MAU if:  1.  Contractions are 10 minutes apart or less and they becoming more uncomfortable or painful over time 2.  You have a large gush of fluid, or a trickle of fluid that will not stop and you have to wear a pad 3.  You have bleeding that is bright red, heavier than spotting--like menstrual bleeding (spotting can be normal in early labor or after a check of your cervix) 4.  You do not feel the baby moving like he/she normally does  

## 2021-01-09 ENCOUNTER — Other Ambulatory Visit: Payer: Self-pay

## 2021-01-09 ENCOUNTER — Ambulatory Visit (INDEPENDENT_AMBULATORY_CARE_PROVIDER_SITE_OTHER): Payer: Medicaid Other | Admitting: *Deleted

## 2021-01-09 DIAGNOSIS — K831 Obstruction of bile duct: Secondary | ICD-10-CM

## 2021-01-09 DIAGNOSIS — Z3A33 33 weeks gestation of pregnancy: Secondary | ICD-10-CM | POA: Diagnosis not present

## 2021-01-09 DIAGNOSIS — O24415 Gestational diabetes mellitus in pregnancy, controlled by oral hypoglycemic drugs: Secondary | ICD-10-CM | POA: Diagnosis not present

## 2021-01-09 NOTE — Progress Notes (Signed)
BP 138/76 P-105  NST-Reactive

## 2021-01-11 ENCOUNTER — Other Ambulatory Visit: Payer: Self-pay | Admitting: Advanced Practice Midwife

## 2021-01-11 DIAGNOSIS — O24415 Gestational diabetes mellitus in pregnancy, controlled by oral hypoglycemic drugs: Secondary | ICD-10-CM

## 2021-01-11 MED ORDER — METFORMIN HCL 500 MG PO TABS: 500.0000 mg | ORAL_TABLET | Freq: Two times a day (BID) | ORAL | 1 refills | Status: DC

## 2021-01-13 ENCOUNTER — Ambulatory Visit (INDEPENDENT_AMBULATORY_CARE_PROVIDER_SITE_OTHER): Payer: Medicaid Other | Admitting: *Deleted

## 2021-01-13 ENCOUNTER — Other Ambulatory Visit: Payer: Self-pay

## 2021-01-13 VITALS — BP 137/77 | HR 112

## 2021-01-13 DIAGNOSIS — O24419 Gestational diabetes mellitus in pregnancy, unspecified control: Secondary | ICD-10-CM

## 2021-01-13 DIAGNOSIS — O24415 Gestational diabetes mellitus in pregnancy, controlled by oral hypoglycemic drugs: Secondary | ICD-10-CM | POA: Diagnosis not present

## 2021-01-13 DIAGNOSIS — Z3401 Encounter for supervision of normal first pregnancy, first trimester: Secondary | ICD-10-CM

## 2021-01-13 DIAGNOSIS — Z3A33 33 weeks gestation of pregnancy: Secondary | ICD-10-CM | POA: Diagnosis not present

## 2021-01-13 DIAGNOSIS — Z8632 Personal history of gestational diabetes: Secondary | ICD-10-CM

## 2021-01-13 NOTE — Progress Notes (Signed)
NST-Reactive and AFI 15.55

## 2021-01-16 ENCOUNTER — Ambulatory Visit (INDEPENDENT_AMBULATORY_CARE_PROVIDER_SITE_OTHER): Payer: Medicaid Other | Admitting: *Deleted

## 2021-01-16 ENCOUNTER — Other Ambulatory Visit: Payer: Self-pay

## 2021-01-16 DIAGNOSIS — O24415 Gestational diabetes mellitus in pregnancy, controlled by oral hypoglycemic drugs: Secondary | ICD-10-CM

## 2021-01-16 DIAGNOSIS — Z8632 Personal history of gestational diabetes: Secondary | ICD-10-CM

## 2021-01-16 DIAGNOSIS — Z3A34 34 weeks gestation of pregnancy: Secondary | ICD-10-CM

## 2021-01-16 NOTE — Progress Notes (Signed)
NST Reactive

## 2021-01-20 ENCOUNTER — Ambulatory Visit (INDEPENDENT_AMBULATORY_CARE_PROVIDER_SITE_OTHER): Payer: Medicaid Other | Admitting: Advanced Practice Midwife

## 2021-01-20 ENCOUNTER — Other Ambulatory Visit: Payer: Self-pay

## 2021-01-20 VITALS — BP 138/84 | HR 104

## 2021-01-20 DIAGNOSIS — O26613 Liver and biliary tract disorders in pregnancy, third trimester: Secondary | ICD-10-CM

## 2021-01-20 DIAGNOSIS — O24415 Gestational diabetes mellitus in pregnancy, controlled by oral hypoglycemic drugs: Secondary | ICD-10-CM | POA: Diagnosis not present

## 2021-01-20 DIAGNOSIS — K831 Obstruction of bile duct: Secondary | ICD-10-CM | POA: Insufficient documentation

## 2021-01-20 DIAGNOSIS — O0993 Supervision of high risk pregnancy, unspecified, third trimester: Secondary | ICD-10-CM

## 2021-01-20 DIAGNOSIS — Z3A34 34 weeks gestation of pregnancy: Secondary | ICD-10-CM

## 2021-01-20 NOTE — Progress Notes (Signed)
   PRENATAL VISIT NOTE  Subjective:  Katherine Munoz is a 25 y.o. G3P1012 at [redacted]w[redacted]d being seen today for ongoing prenatal care.  She is currently monitored for the following issues for this high-risk pregnancy and has Supervision of low-risk first pregnancy, first trimester; History of gestational diabetes; Lactose intolerance in adult; Gestational diabetes; and Intrahepatic cholestasis of pregnancy, antepartum, third trimester on their problem list.  Patient reports occasional contractions.  Contractions: Irregular. Vag. Bleeding: None.  Movement: Present. Denies leaking of fluid.   The following portions of the patient's history were reviewed and updated as appropriate: allergies, current medications, past family history, past medical history, past social history, past surgical history and problem list.   Objective:   Vitals:   01/20/21 0947  BP: 138/84  Pulse: (!) 104    Fetal Status:     Movement: Present  Presentation: Vertex  General:  Alert, oriented and cooperative. Patient is in no acute distress.  Skin: Skin is warm and dry. No rash noted.   Cardiovascular: Normal heart rate noted  Respiratory: Normal respiratory effort, no problems with respiration noted  Abdomen: Soft, gravid, appropriate for gestational age.  Pain/Pressure: Present     Pelvic: Cervical exam deferred        Extremities: Normal range of motion.  Edema: None  Mental Status: Normal mood and affect. Normal behavior. Normal judgment and thought content.   Assessment and Plan:  Pregnancy: G3P1012 at [redacted]w[redacted]d 1. Supervision of high risk pregnancy in third trimester --Anticipatory guidance about next visits/weeks of pregnancy given. --Next visit in 2 weeks  2. Gestational diabetes mellitus (GDM) in third trimester controlled on oral hypoglycemic drug --Reviewed glucose log.  Pt started metformin 500 mg BID after last OB visit.  Fastings all wnl since starting metformin.  Postprandial values with one elevated the  first day of medication at 150, all wnl after medication.   --Continue current couse  3. [redacted] weeks gestation of pregnancy   4. Intrahepatic cholestasis of pregnancy, antepartum, third trimester --IOL at 37 weeks, scheduled for 10/1 at midnight. --continue antenatal testing  Preterm labor symptoms and general obstetric precautions including but not limited to vaginal bleeding, contractions, leaking of fluid and fetal movement were reviewed in detail with the patient. Please refer to After Visit Summary for other counseling recommendations.   No follow-ups on file.  Future Appointments  Date Time Provider Department Center  01/23/2021  9:30 AM [facility] NURSE [facility]  01/26/2021 12:45 PM [facility]URSE [facility]  01/26/2021  1:00 PM [facility] US1 [facility]  01/30/2021 10:30 AM Rasch, Harolyn Rutherford, NP [facility]    Sharen Counter, CNM

## 2021-01-20 NOTE — Progress Notes (Signed)
Pt c/o increase in pressure and contractions

## 2021-01-23 ENCOUNTER — Ambulatory Visit (INDEPENDENT_AMBULATORY_CARE_PROVIDER_SITE_OTHER): Payer: Medicaid Other | Admitting: *Deleted

## 2021-01-23 ENCOUNTER — Other Ambulatory Visit: Payer: Self-pay

## 2021-01-23 DIAGNOSIS — Z3A34 34 weeks gestation of pregnancy: Secondary | ICD-10-CM

## 2021-01-23 DIAGNOSIS — O24415 Gestational diabetes mellitus in pregnancy, controlled by oral hypoglycemic drugs: Secondary | ICD-10-CM

## 2021-01-23 DIAGNOSIS — K831 Obstruction of bile duct: Secondary | ICD-10-CM

## 2021-01-23 DIAGNOSIS — O26613 Liver and biliary tract disorders in pregnancy, third trimester: Secondary | ICD-10-CM

## 2021-01-23 NOTE — Progress Notes (Signed)
NST-Reactive today.  BP 125/79 P-98

## 2021-01-26 ENCOUNTER — Other Ambulatory Visit: Payer: Self-pay

## 2021-01-26 ENCOUNTER — Ambulatory Visit: Payer: Medicaid Other | Attending: Obstetrics

## 2021-01-26 ENCOUNTER — Ambulatory Visit: Payer: Medicaid Other | Admitting: *Deleted

## 2021-01-26 ENCOUNTER — Encounter: Payer: Self-pay | Admitting: *Deleted

## 2021-01-26 ENCOUNTER — Telehealth (HOSPITAL_COMMUNITY): Payer: Self-pay | Admitting: *Deleted

## 2021-01-26 VITALS — BP 120/79 | HR 104

## 2021-01-26 DIAGNOSIS — Z8632 Personal history of gestational diabetes: Secondary | ICD-10-CM

## 2021-01-26 DIAGNOSIS — O24419 Gestational diabetes mellitus in pregnancy, unspecified control: Secondary | ICD-10-CM | POA: Diagnosis present

## 2021-01-26 DIAGNOSIS — O0993 Supervision of high risk pregnancy, unspecified, third trimester: Secondary | ICD-10-CM

## 2021-01-26 DIAGNOSIS — Z3401 Encounter for supervision of normal first pregnancy, first trimester: Secondary | ICD-10-CM | POA: Insufficient documentation

## 2021-01-26 DIAGNOSIS — Z3A35 35 weeks gestation of pregnancy: Secondary | ICD-10-CM

## 2021-01-26 DIAGNOSIS — O09293 Supervision of pregnancy with other poor reproductive or obstetric history, third trimester: Secondary | ICD-10-CM | POA: Diagnosis not present

## 2021-01-26 DIAGNOSIS — O24415 Gestational diabetes mellitus in pregnancy, controlled by oral hypoglycemic drugs: Secondary | ICD-10-CM | POA: Diagnosis not present

## 2021-01-26 DIAGNOSIS — O24410 Gestational diabetes mellitus in pregnancy, diet controlled: Secondary | ICD-10-CM

## 2021-01-26 NOTE — Telephone Encounter (Signed)
Preadmission screen  

## 2021-01-27 ENCOUNTER — Encounter (HOSPITAL_COMMUNITY): Payer: Self-pay

## 2021-01-27 ENCOUNTER — Encounter (HOSPITAL_COMMUNITY): Payer: Self-pay | Admitting: *Deleted

## 2021-01-27 ENCOUNTER — Other Ambulatory Visit: Payer: Medicaid Other

## 2021-01-27 ENCOUNTER — Telehealth (HOSPITAL_COMMUNITY): Payer: Self-pay | Admitting: *Deleted

## 2021-01-27 NOTE — Telephone Encounter (Signed)
Preadmission screen  

## 2021-01-28 ENCOUNTER — Other Ambulatory Visit: Payer: Self-pay | Admitting: Advanced Practice Midwife

## 2021-01-28 DIAGNOSIS — O24415 Gestational diabetes mellitus in pregnancy, controlled by oral hypoglycemic drugs: Secondary | ICD-10-CM

## 2021-01-28 DIAGNOSIS — O0993 Supervision of high risk pregnancy, unspecified, third trimester: Secondary | ICD-10-CM

## 2021-01-30 ENCOUNTER — Other Ambulatory Visit: Payer: Self-pay

## 2021-01-30 ENCOUNTER — Other Ambulatory Visit (HOSPITAL_COMMUNITY)
Admission: RE | Admit: 2021-01-30 | Discharge: 2021-01-30 | Disposition: A | Discharge status: Home / Self Care | Payer: Medicaid Other | Source: Ambulatory Visit | Attending: Obstetrics and Gynecology | Admitting: Obstetrics and Gynecology

## 2021-01-30 ENCOUNTER — Ambulatory Visit (INDEPENDENT_AMBULATORY_CARE_PROVIDER_SITE_OTHER): Payer: Medicaid Other | Admitting: Obstetrics and Gynecology

## 2021-01-30 ENCOUNTER — Other Ambulatory Visit: Payer: Self-pay | Admitting: Advanced Practice Midwife

## 2021-01-30 VITALS — BP 122/82 | HR 111 | Wt 173.0 lb

## 2021-01-30 DIAGNOSIS — O0993 Supervision of high risk pregnancy, unspecified, third trimester: Secondary | ICD-10-CM | POA: Insufficient documentation

## 2021-01-30 LAB — OB RESULTS CONSOLE GC/CHLAMYDIA: Gonorrhea: NEGATIVE

## 2021-01-30 LAB — OB RESULTS CONSOLE GBS: GBS: NEGATIVE

## 2021-01-30 NOTE — Patient Instructions (Signed)

## 2021-01-30 NOTE — Progress Notes (Signed)
   PRENATAL VISIT NOTE  Subjective:  Katherine Munoz is a 25 y.o. G3P1012 at [redacted]w[redacted]d being seen today for ongoing prenatal care.  She is currently monitored for the following issues for this low-risk pregnancy and has Supervision of low-risk first pregnancy, first trimester; History of gestational diabetes; Lactose intolerance in adult; Gestational diabetes; and Intrahepatic cholestasis of pregnancy, antepartum, third trimester on their problem list.  Patient reports no complaints.  Contractions: Irritability. Vag. Bleeding: None.  Movement: Present. Denies leaking of fluid.   The following portions of the patient's history were reviewed and updated as appropriate: allergies, current medications, past family history, past medical history, past social history, past surgical history and problem list.   Objective:   Vitals:   01/30/21 1036  BP: 122/82  Pulse: (!) 111  Weight: 173 lb (78.5 kg)    Fetal Status: Fetal Heart Rate (bpm): 150 Fundal Height: 37 cm Movement: Present     General:  Alert, oriented and cooperative. Patient is in no acute distress.  Skin: Skin is warm and dry. No rash noted.   Cardiovascular: Normal heart rate noted  Respiratory: Normal respiratory effort, no problems with respiration noted  Abdomen: Soft, gravid, appropriate for gestational age.  Pain/Pressure: Present     Pelvic: Cervical exam performed in the presence of a chaperone        Extremities: Normal range of motion.  Edema: None  Mental Status: Normal mood and affect. Normal behavior. Normal judgment and thought content.   Assessment and Plan:  Pregnancy: G3P1012 at [redacted]w[redacted]d 1. Supervision of high risk pregnancy in third trimester  - Induction scheduled  - AFI and NST next week.  - Culture, beta strep (group b only) - Cervicovaginal ancillary only( [place]) - 6/44 blood sugars out of range. Mostly post dinner. Continue metformin at current dose.   Preterm labor symptoms and general obstetric  precautions including but not limited to vaginal bleeding, contractions, leaking of fluid and fetal movement were reviewed in detail with the patient. Please refer to After Visit Summary for other counseling recommendations.   No follow-ups on file.  Future Appointments  Date Time Provider Department Center  02/03/2021  9:30 AM [facility] NURSE [facility]  02/07/2021 12:00 AM [facility] SCHED ROOM [facility] None    Venia Carbon, NP

## 2021-02-02 LAB — CULTURE, BETA STREP (GROUP B ONLY)
MICRO NUMBER:: 12415497
SPECIMEN QUALITY:: ADEQUATE

## 2021-02-03 ENCOUNTER — Ambulatory Visit (INDEPENDENT_AMBULATORY_CARE_PROVIDER_SITE_OTHER): Payer: Medicaid Other | Admitting: *Deleted

## 2021-02-03 ENCOUNTER — Other Ambulatory Visit: Payer: Self-pay

## 2021-02-03 VITALS — BP 125/67 | HR 114 | Wt 173.0 lb

## 2021-02-03 DIAGNOSIS — O24410 Gestational diabetes mellitus in pregnancy, diet controlled: Secondary | ICD-10-CM

## 2021-02-03 DIAGNOSIS — Z3401 Encounter for supervision of normal first pregnancy, first trimester: Secondary | ICD-10-CM

## 2021-02-03 DIAGNOSIS — Z8632 Personal history of gestational diabetes: Secondary | ICD-10-CM

## 2021-02-03 DIAGNOSIS — Z3A36 36 weeks gestation of pregnancy: Secondary | ICD-10-CM | POA: Diagnosis not present

## 2021-02-03 DIAGNOSIS — O24419 Gestational diabetes mellitus in pregnancy, unspecified control: Secondary | ICD-10-CM

## 2021-02-03 LAB — CERVICOVAGINAL ANCILLARY ONLY
Chlamydia: NEGATIVE
Comment: NEGATIVE
Comment: NORMAL
Neisseria Gonorrhea: NEGATIVE

## 2021-02-05 ENCOUNTER — Other Ambulatory Visit: Payer: Self-pay | Admitting: Obstetrics and Gynecology

## 2021-02-05 LAB — SARS CORONAVIRUS 2 (TAT 6-24 HRS): SARS Coronavirus 2: NEGATIVE

## 2021-02-07 ENCOUNTER — Inpatient Hospital Stay (HOSPITAL_COMMUNITY): Payer: Medicaid Other

## 2021-02-08 ENCOUNTER — Inpatient Hospital Stay (HOSPITAL_COMMUNITY): Payer: Medicaid Other | Admitting: Anesthesiology

## 2021-02-08 ENCOUNTER — Inpatient Hospital Stay (HOSPITAL_COMMUNITY)
Admission: AD | Admit: 2021-02-08 | Discharge: 2021-02-09 | DRG: 805 | Disposition: A | Discharge status: Home / Self Care | Payer: Medicaid Other | Attending: Obstetrics and Gynecology | Admitting: Obstetrics and Gynecology

## 2021-02-08 ENCOUNTER — Encounter (HOSPITAL_COMMUNITY): Payer: Self-pay | Admitting: Obstetrics and Gynecology

## 2021-02-08 ENCOUNTER — Other Ambulatory Visit: Payer: Self-pay

## 2021-02-08 DIAGNOSIS — O24425 Gestational diabetes mellitus in childbirth, controlled by oral hypoglycemic drugs: Secondary | ICD-10-CM | POA: Diagnosis present

## 2021-02-08 DIAGNOSIS — L299 Pruritus, unspecified: Secondary | ICD-10-CM | POA: Diagnosis present

## 2021-02-08 DIAGNOSIS — O24424 Gestational diabetes mellitus in childbirth, insulin controlled: Secondary | ICD-10-CM | POA: Diagnosis not present

## 2021-02-08 DIAGNOSIS — O26649 Intrahepatic cholestasis of pregnancy, unspecified trimester: Secondary | ICD-10-CM | POA: Diagnosis present

## 2021-02-08 DIAGNOSIS — K831 Obstruction of bile duct: Secondary | ICD-10-CM | POA: Diagnosis present

## 2021-02-08 DIAGNOSIS — O2662 Liver and biliary tract disorders in childbirth: Principal | ICD-10-CM | POA: Diagnosis present

## 2021-02-08 DIAGNOSIS — Z8632 Personal history of gestational diabetes: Secondary | ICD-10-CM | POA: Diagnosis present

## 2021-02-08 DIAGNOSIS — O26619 Liver and biliary tract disorders in pregnancy, unspecified trimester: Secondary | ICD-10-CM | POA: Diagnosis present

## 2021-02-08 DIAGNOSIS — Z3401 Encounter for supervision of normal first pregnancy, first trimester: Secondary | ICD-10-CM

## 2021-02-08 DIAGNOSIS — O24419 Gestational diabetes mellitus in pregnancy, unspecified control: Secondary | ICD-10-CM | POA: Diagnosis present

## 2021-02-08 DIAGNOSIS — O26893 Other specified pregnancy related conditions, third trimester: Secondary | ICD-10-CM | POA: Diagnosis present

## 2021-02-08 DIAGNOSIS — O26613 Liver and biliary tract disorders in pregnancy, third trimester: Secondary | ICD-10-CM | POA: Diagnosis present

## 2021-02-08 DIAGNOSIS — Z3A37 37 weeks gestation of pregnancy: Secondary | ICD-10-CM

## 2021-02-08 DIAGNOSIS — O26643 Intrahepatic cholestasis of pregnancy, third trimester: Secondary | ICD-10-CM | POA: Diagnosis present

## 2021-02-08 LAB — CBC
HCT: 34.7 % — ABNORMAL LOW (ref 36.0–46.0)
Hemoglobin: 11.2 g/dL — ABNORMAL LOW (ref 12.0–15.0)
MCH: 27.2 pg (ref 26.0–34.0)
MCHC: 32.3 g/dL (ref 30.0–36.0)
MCV: 84.2 fL (ref 80.0–100.0)
Platelets: 207 10*3/uL (ref 150–400)
RBC: 4.12 MIL/uL (ref 3.87–5.11)
RDW: 13.2 % (ref 11.5–15.5)
WBC: 12.1 10*3/uL — ABNORMAL HIGH (ref 4.0–10.5)
nRBC: 0 % (ref 0.0–0.2)

## 2021-02-08 LAB — GLUCOSE, CAPILLARY
Glucose-Capillary: 93 mg/dL (ref 70–99)
Glucose-Capillary: 87 mg/dL (ref 70–99)
Glucose-Capillary: 86 mg/dL (ref 70–99)

## 2021-02-08 LAB — TYPE AND SCREEN
ABO/RH(D): O POS
Antibody Screen: NEGATIVE

## 2021-02-08 MED ORDER — TERBUTALINE SULFATE 1 MG/ML IJ SOLN
0.2500 mg | Freq: Once | INTRAMUSCULAR | Status: DC | PRN
Start: 2021-02-08 — End: 2021-02-08

## 2021-02-08 MED ORDER — FENTANYL CITRATE (PF) 100 MCG/2ML IJ SOLN
100.0000 ug | INTRAMUSCULAR | Status: DC | PRN
  Filled 2021-02-08: qty 2

## 2021-02-08 MED ORDER — ONDANSETRON HCL 4 MG/2ML IJ SOLN: 4.0000 mg | INTRAMUSCULAR | Status: DC | PRN

## 2021-02-08 MED ORDER — MISOPROSTOL 25 MCG QUARTER TABLET: 25.0000 ug | ORAL_TABLET | ORAL | Status: DC | PRN

## 2021-02-08 MED ORDER — FENTANYL-BUPIVACAINE-NACL 0.5-0.125-0.9 MG/250ML-% EP SOLN: 12.0000 mL/h | EPIDURAL | Status: DC | PRN

## 2021-02-08 MED ORDER — ACETAMINOPHEN 325 MG PO TABS: 650.0000 mg | ORAL_TABLET | ORAL | Status: DC | PRN

## 2021-02-08 MED ORDER — OXYTOCIN-SODIUM CHLORIDE 30-0.9 UT/500ML-% IV SOLN
1.0000 m[IU]/min | INTRAVENOUS | Status: DC
  Administered 2021-02-08: 2 m[IU]/min via INTRAVENOUS

## 2021-02-08 MED ORDER — DIBUCAINE (PERIANAL) 1 % EX OINT: 1.0000 "application " | TOPICAL_OINTMENT | CUTANEOUS | Status: DC | PRN

## 2021-02-08 MED ORDER — DIPHENHYDRAMINE HCL 25 MG PO CAPS: 25.0000 mg | ORAL_CAPSULE | Freq: Four times a day (QID) | ORAL | Status: DC | PRN

## 2021-02-08 MED ORDER — OXYCODONE-ACETAMINOPHEN 5-325 MG PO TABS: 2.0000 | ORAL_TABLET | ORAL | Status: DC | PRN

## 2021-02-08 MED ORDER — OXYCODONE-ACETAMINOPHEN 5-325 MG PO TABS: 1.0000 | ORAL_TABLET | ORAL | Status: DC | PRN

## 2021-02-08 MED ORDER — COCONUT OIL OIL: 1.0000 "application " | TOPICAL_OIL | Status: DC | PRN

## 2021-02-08 MED ORDER — METFORMIN HCL 500 MG PO TABS
500.0000 mg | ORAL_TABLET | Freq: Two times a day (BID) | ORAL | Status: DC
  Filled 2021-02-08 (×2): qty 1

## 2021-02-08 MED ORDER — LIDOCAINE HCL (PF) 1 % IJ SOLN: 30.0000 mL | INTRAMUSCULAR | Status: DC | PRN

## 2021-02-08 MED ORDER — ONDANSETRON HCL 4 MG/2ML IJ SOLN
4.0000 mg | Freq: Four times a day (QID) | INTRAMUSCULAR | Status: DC | PRN
Start: 2021-02-08 — End: 2021-02-08

## 2021-02-08 MED ORDER — LACTATED RINGERS IV SOLN: 500.0000 mL | INTRAVENOUS | Status: DC | PRN

## 2021-02-08 MED ORDER — SIMETHICONE 80 MG PO CHEW: 80.0000 mg | CHEWABLE_TABLET | ORAL | Status: DC | PRN

## 2021-02-08 MED ORDER — EPHEDRINE 5 MG/ML INJ
10.0000 mg | INTRAVENOUS | Status: DC | PRN
Start: 2021-02-08 — End: 2021-02-08

## 2021-02-08 MED ORDER — LACTATED RINGERS IV SOLN: INTRAVENOUS | Status: DC

## 2021-02-08 MED ORDER — BENZOCAINE-MENTHOL 20-0.5 % EX AERO
1.0000 "application " | INHALATION_SPRAY | CUTANEOUS | Status: DC | PRN
  Filled 2021-02-08: qty 56

## 2021-02-08 MED ORDER — PRENATAL MULTIVITAMIN CH
1.0000 | ORAL_TABLET | Freq: Every day | ORAL | Status: DC
  Administered 2021-02-09: 1 via ORAL
  Filled 2021-02-08: qty 1

## 2021-02-08 MED ORDER — WITCH HAZEL-GLYCERIN EX PADS: 1.0000 "application " | MEDICATED_PAD | CUTANEOUS | Status: DC | PRN

## 2021-02-08 MED ORDER — FENTANYL-BUPIVACAINE-NACL 0.5-0.125-0.9 MG/250ML-% EP SOLN
EPIDURAL | Status: DC | PRN
  Administered 2021-02-08: 12 mL/h via EPIDURAL

## 2021-02-08 MED ORDER — PHENYLEPHRINE 40 MCG/ML (10ML) SYRINGE FOR IV PUSH (FOR BLOOD PRESSURE SUPPORT)
80.0000 ug | PREFILLED_SYRINGE | INTRAVENOUS | Status: DC | PRN
Start: 2021-02-08 — End: 2021-02-08

## 2021-02-08 MED ORDER — LACTATED RINGERS IV SOLN
500.0000 mL | Freq: Once | INTRAVENOUS | Status: AC
  Administered 2021-02-08: 500 mL via INTRAVENOUS

## 2021-02-08 MED ORDER — OXYTOCIN BOLUS FROM INFUSION
333.0000 mL | Freq: Once | INTRAVENOUS | Status: AC
  Administered 2021-02-08: 333 mL via INTRAVENOUS

## 2021-02-08 MED ORDER — EPHEDRINE 5 MG/ML INJ: 10.0000 mg | INTRAVENOUS | Status: DC | PRN

## 2021-02-08 MED ORDER — ZOLPIDEM TARTRATE 5 MG PO TABS: 5.0000 mg | ORAL_TABLET | Freq: Every evening | ORAL | Status: DC | PRN

## 2021-02-08 MED ORDER — IBUPROFEN 600 MG PO TABS
600.0000 mg | ORAL_TABLET | Freq: Four times a day (QID) | ORAL | Status: DC
  Administered 2021-02-09 (×5): 600 mg via ORAL
  Filled 2021-02-08 (×5): qty 1

## 2021-02-08 MED ORDER — URSODIOL 300 MG PO CAPS
300.0000 mg | ORAL_CAPSULE | Freq: Three times a day (TID) | ORAL | Status: DC
  Administered 2021-02-08: 300 mg via ORAL
  Filled 2021-02-08 (×2): qty 1

## 2021-02-08 MED ORDER — LIDOCAINE HCL (PF) 1 % IJ SOLN
INTRAMUSCULAR | Status: DC | PRN
  Administered 2021-02-08: 2 mL via EPIDURAL
  Administered 2021-02-08: 10 mL via EPIDURAL

## 2021-02-08 MED ORDER — HYDROXYZINE HCL 10 MG PO TABS
25.0000 mg | ORAL_TABLET | Freq: Three times a day (TID) | ORAL | Status: DC | PRN
  Filled 2021-02-08: qty 3

## 2021-02-08 MED ORDER — OXYTOCIN-SODIUM CHLORIDE 30-0.9 UT/500ML-% IV SOLN
2.5000 [IU]/h | INTRAVENOUS | Status: DC
  Filled 2021-02-08: qty 500

## 2021-02-08 MED ORDER — SOD CITRATE-CITRIC ACID 500-334 MG/5ML PO SOLN: 30.0000 mL | ORAL | Status: DC | PRN

## 2021-02-08 MED ORDER — PHENYLEPHRINE 40 MCG/ML (10ML) SYRINGE FOR IV PUSH (FOR BLOOD PRESSURE SUPPORT): 80.0000 ug | PREFILLED_SYRINGE | INTRAVENOUS | Status: DC | PRN

## 2021-02-08 MED ORDER — ONDANSETRON HCL 4 MG PO TABS: 4.0000 mg | ORAL_TABLET | ORAL | Status: DC | PRN

## 2021-02-08 MED ORDER — FENTANYL-BUPIVACAINE-NACL 0.5-0.125-0.9 MG/250ML-% EP SOLN
EPIDURAL | Status: AC
  Filled 2021-02-08: qty 250

## 2021-02-08 MED ORDER — DIPHENHYDRAMINE HCL 50 MG/ML IJ SOLN
12.5000 mg | INTRAMUSCULAR | Status: DC | PRN
Start: 2021-02-08 — End: 2021-02-08

## 2021-02-08 MED ORDER — TETANUS-DIPHTH-ACELL PERTUSSIS 5-2.5-18.5 LF-MCG/0.5 IM SUSY: 0.5000 mL | PREFILLED_SYRINGE | Freq: Once | INTRAMUSCULAR | Status: DC

## 2021-02-08 MED ORDER — SENNOSIDES-DOCUSATE SODIUM 8.6-50 MG PO TABS
2.0000 | ORAL_TABLET | Freq: Every day | ORAL | Status: DC
  Administered 2021-02-09: 2 via ORAL
  Filled 2021-02-08: qty 2

## 2021-02-08 MED ORDER — TERBUTALINE SULFATE 1 MG/ML IJ SOLN: 0.2500 mg | Freq: Once | INTRAMUSCULAR | Status: DC | PRN

## 2021-02-08 NOTE — Plan of Care (Signed)
  Problem: Education: Goal: Knowledge of Childbirth will improve Outcome: Completed/Met Goal: Ability to make informed decisions regarding treatment and plan of care will improve Outcome: Completed/Met Goal: Ability to state and carry out methods to decrease the pain will improve Outcome: Completed/Met Goal: Individualized Educational Video(s) Outcome: Completed/Met

## 2021-02-08 NOTE — Anesthesia Preprocedure Evaluation (Signed)
Anesthesia Evaluation  Patient identified by MRN, date of birth, ID band Patient awake    Reviewed: Allergy & Precautions, Patient's Chart, lab work & pertinent test results  Airway Mallampati: II  TM Distance: >3 FB Neck ROM: Full    Dental no notable dental hx.    Pulmonary neg pulmonary ROS,    Pulmonary exam normal breath sounds clear to auscultation       Cardiovascular negative cardio ROS Normal cardiovascular exam Rhythm:Regular Rate:Normal     Neuro/Psych negative neurological ROS  negative psych ROS   GI/Hepatic negative GI ROS, Neg liver ROS,   Endo/Other  diabetes, Well Controlled, Gestational  Renal/GU negative Renal ROS  negative genitourinary   Musculoskeletal negative musculoskeletal ROS (+)   Abdominal   Peds negative pediatric ROS (+)  Hematology negative hematology ROS (+) hct 34.7, plt 207   Anesthesia Other Findings   Reproductive/Obstetrics (+) Pregnancy Hx twin delivery w/ epidural Attempted unmedicated birth for this delivery, now 6-7cm                             Anesthesia Physical Anesthesia Plan  ASA: 2  Anesthesia Plan: Epidural   Post-op Pain Management:    Induction:   PONV Risk Score and Plan: 2  Airway Management Planned: Natural Airway  Additional Equipment: None  Intra-op Plan:   Post-operative Plan:   Informed Consent: I have reviewed the patients History and Physical, chart, labs and discussed the procedure including the risks, benefits and alternatives for the proposed anesthesia with the patient or authorized representative who has indicated his/her understanding and acceptance.       Plan Discussed with:   Anesthesia Plan Comments:         Anesthesia Quick Evaluation

## 2021-02-08 NOTE — Lactation Note (Signed)
This note was copied from a baby's chart. Lactation Consultation Note  Patient Name: Girl Kalicia Dufresne YJEHU'D Date: 02/08/2021 Reason for consult: L&D Initial assessment;Early term 37-38.6wks Age:27 hours   Mom holding infant in cradle hold. Infant had a shallow latch so LC provided more pillow support under mom's arms and used a rolled blanket for under baby's head.  When infant came off the breast the nipple was pinched.     Mom was encouraged to wait until infant opened her mouth wide to bring her to the breast.  Infant latched deeply in cross cradle, cheeks were flush to breast, mom felt baby was latching well.   Mom was encouraged to feed with cues and hold baby STS.  LC will follow up with mom when she comes to [facility]. Maternal Data Has patient been taught Hand Expression?: Yes Does the patient have breastfeeding experience prior to this delivery?: Yes How long did the patient breastfeed?: 4 twins  Feeding Mother's Current Feeding Choice: Breast Milk  LATCH Score Latch: Repeated attempts needed to sustain latch, nipple held in mouth throughout feeding, stimulation needed to elicit sucking reflex.  Audible Swallowing: Spontaneous and intermittent  Type of Nipple: Everted at rest and after stimulation  Comfort (Breast/Nipple): Soft / non-tender  Hold (Positioning): Assistance needed to correctly position infant at breast and maintain latch.  LATCH Score: 8   Lactation Tools Discussed/Used    Interventions Interventions: Support pillows;Adjust position;Skin to skin;Assisted with latch  Discharge    Consult Status Consult Status: Follow-up from L&D Date: 02/08/21 Follow-up type: In-patient    Maryruth Hancock [facility] 02/08/2021, 10:42 PM

## 2021-02-08 NOTE — H&P (Addendum)
OBSTETRIC ADMISSION HISTORY AND PHYSICAL  Katherine Munoz is a 25 y.o. female [phone] with IUP at [redacted]w[redacted]d by first trimester [place] presenting for IOL 2/2 ICP. She reports +FMs, No LOF, no VB, no blurry vision, headaches or peripheral edema, and RUQ pain.  She plans on breast feeding. She request nothing for birth control. She received her prenatal care at  [facility]    Dating: By 1st trimester [place] --->  Estimated Date of Delivery: 02/27/21  Sono:    @[redacted]w[redacted]d , CWD, normal anatomy, cephalic presentation, 3293g, EFW  Prenatal History/Complications:  Hx of GDM- normal 2 hr gtt Hx of cholestasis during pregnancy A2GDM- Metformin 500 mg BID  Past Medical History: Past Medical History:  Diagnosis Date   Cholestasis during pregnancy    Gestational diabetes    IBS (irritable bowel syndrome)    Medical history non-contributory    Scoliosis     Past Surgical History: Past Surgical History:  Procedure Laterality Date   TONSILLECTOMY     WISDOM TOOTH EXTRACTION      Obstetrical History: OB History     Gravida  3   Para  1   Term  1   Preterm  0   AB  1   Living  2      SAB  1   IAB      Ectopic      Multiple  1   Live Births  1           Social History Social History   Socioeconomic History   Marital status: Significant Other    Spouse name: Not on file   Number of children: Not on file   Years of education: Not on file   Highest education level: Not on file  Occupational History   Not on file  Tobacco Use   Smoking status: Never   Smokeless tobacco: Never  Vaping Use   Vaping Use: Never used  Substance and Sexual Activity   Alcohol use: Yes    Comment: Socially   Drug use: No   Sexual activity: Yes    Birth control/protection: None  Other Topics Concern   Not on file  Social History Narrative   Not on file   Social Determinants of Health   Financial Resource Strain: Not on file  Food Insecurity: Not on file  Transportation Needs: Not on file   Physical Activity: Not on file  Stress: Not on file  Social Connections: Not on file    Family History: Family History  Problem Relation Age of Onset   Heart disease Maternal Grandfather    Breast cancer Other     Allergies: No Known Allergies  Medications Prior to Admission  Medication Sig Dispense Refill Last Dose   hydrOXYzine (VISTARIL) 25 MG capsule Take 1 capsule (25 mg total) by mouth 3 (three) times daily as needed. 30 capsule 2 02/07/2021 at 2100   metFORMIN (GLUCOPHAGE) 500 MG tablet Take 1 tablet (500 mg total) by mouth 2 (two) times daily with a meal. 60 tablet 1 02/07/2021 at 2100   Prenatal Vit-Fe Fumarate-FA (PRENATAL 19 PO) Take by mouth.   02/07/2021   ursodiol (ACTIGALL) 300 MG capsule Take 1 capsule (300 mg total) by mouth 3 (three) times daily. 90 capsule 0 02/07/2021 at 2100   Accu-Chek Softclix Lancets lancets 1 each by Other route 4 (four) times daily. Use as instructed 100 each 12    aspirin 81 MG chewable tablet Chew 1 tablet (81 mg total)  by mouth daily. 90 tablet 1 More than a month   glucose blood (ACCU-CHEK GUIDE) test strip Pt to test blood glucoses QID Disp strips to use with her monitor 100 each 12      Review of Systems   All systems reviewed and negative except as stated in HPI  Last menstrual period 05/23/2020. General appearance: alert, cooperative, appears stated age, and no distress Lungs: clear to auscultation bilaterally Heart: regular rate and rhythm Abdomen: gravid, soft, non-tender Pelvic: deferred; prefers midwife Extremities: Homans sign is negative, no sign of DVT Presentation: cephalic, leopolds Fetal monitoringBaseline: 150 bpm, Variability: Good {> 6 bpm), Accelerations: Reactive, and Decelerations: Absent Uterine activity Irritability    Prenatal labs: ABO, Rh: O/RH(D) POSITIVE/-- (03/23 0000) Antibody: NO ANTIBODIES DETECTED (03/23 0000) Rubella: 1.09 (03/23 0000) RPR: NON-REACTIVE (07/26 0840)  HBsAg: NON-REACTIVE  (03/23 0000)  HIV: NON-REACTIVE (07/26 0840)  GBS:   Negative 1 hr Glucola 78 Genetic screening: Low risk, negative  Anatomy US wnl  Prenatal Transfer Tool  Maternal Diabetes: Yes:  Diabetes Type:  Insulin/Medication controlled Genetic Screening: Normal Maternal Ultrasounds/Referrals: Normal Fetal Ultrasounds or other Referrals:  None Maternal Substance Abuse:  No Significant Maternal Medications:  Meds include: Other:   Metformin, vistaril, ursodiol Significant Maternal Lab Results: Group B Strep negative  Results for orders placed or performed during the hospital encounter of 02/08/21 (from the past 24 hour(s))  CBC   Collection Time: 02/08/21 12:07 PM  Result Value Ref Range   WBC 12.1 (H) 4.0 - 10.5 K/uL   RBC 4.12 3.87 - 5.11 MIL/uL   Hemoglobin 11.2 (L) 12.0 - 15.0 g/dL   HCT 24.4 (L) 01.0 - 27.2 %   MCV 84.2 80.0 - 100.0 fL   MCH 27.2 26.0 - 34.0 pg   MCHC 32.3 30.0 - 36.0 g/dL   RDW 53.6 64.4 - 03.4 %   Platelets 207 150 - 400 K/uL   nRBC 0.0 0.0 - 0.2 %  Type and screen   Collection Time: 02/08/21 12:07 PM  Result Value Ref Range   ABO/RH(D) PENDING    Antibody Screen PENDING    Sample Expiration      02/11/2021,2359 Performed at [facility], [address] [place], [place] [address]   Glucose, capillary   Collection Time: 02/08/21 12:34 PM  Result Value Ref Range   Glucose-Capillary 93 70 - 99 mg/dL    Patient Active Problem List   Diagnosis Date Noted   Cholestasis during pregnancy 02/08/2021   Intrahepatic cholestasis of pregnancy, antepartum, third trimester 01/20/2021   Gestational diabetes 12/03/2020   History of gestational diabetes 08/01/2020   Supervision of low-risk first pregnancy, first trimester 07/30/2020   Lactose intolerance in adult 12/19/2017    Assessment/Plan:  Katherine Munoz is a 25 y.o. G3P1012 at [redacted]w[redacted]d here for IOL 2/2 ICP.   ICP -Pruritis controlled. Continue ursodiol  A2GDM -CBGs q4h in latent labor, q2h in active  labor -Hold metformin wit the start of pitocin  #Labor:Attempted FB, start low dose pitocin #Pain: Maternally supported, natural birth preference  #FWB: Cat I #ID:  GBS neg #MOF: Breast #MOC: Declines #Circ:  N/a, female  Simone Autry-Lott, DO  02/08/2021, 12:07 PM  I was present for the exam and agree with above. Cervix was initially 1.5 cm internal os, 3 cm external os. Foley balloon inserted. Balloon inflated to 40 cc and pushed through cervix despite attempting to keep in the internal os. Cervix reevaluated and found to be tight 3  cm internal. Will start pitocin since pt is a multip w/ a soft cervix.Still may need to backtrack and get foley or Cytotec if not progressing.  Katrinka Blazing, [place], CNM 02/08/2021 2:03 PM

## 2021-02-08 NOTE — Progress Notes (Signed)
Katherine Munoz is a 25 y.o. G3P1012 at [redacted]w[redacted]d.  Subjective: Increased discomfort w/ contractions.   Objective: BP 102/69   Pulse (!) 108   Temp 98.8 F (37.1 C) (Oral)   Resp 17   Ht 5\' 1"  (1.549 m)   Wt 78.5 kg   LMP 05/23/2020   BMI 32.71 kg/m    FHT:  FHR: 130 bpm, variability: mod,  accelerations:  15x15,  decelerations:  none UC:   Q 2-4 minutes, mod Dilation: 4 Effacement (%): 70, 80 Cervical Position: Posterior Station: -2 Presentation: Vertex Exam by:: h stone rnc AROM mod amount of clear fluid.   Labs: Results for orders placed or performed during the hospital encounter of 02/08/21 (from the past 24 hour(s))  CBC     Status: Abnormal   Collection Time: 02/08/21 12:07 PM  Result Value Ref Range   WBC 12.1 (H) 4.0 - 10.5 K/uL   RBC 4.12 3.87 - 5.11 MIL/uL   Hemoglobin 11.2 (L) 12.0 - 15.0 g/dL   HCT 04/10/21 (L) 84.6 - 96.2 %   MCV 84.2 80.0 - 100.0 fL   MCH 27.2 26.0 - 34.0 pg   MCHC 32.3 30.0 - 36.0 g/dL   RDW 95.2 84.1 - 32.4 %   Platelets 207 150 - 400 K/uL   nRBC 0.0 0.0 - 0.2 %  Type and screen     Status: None   Collection Time: 02/08/21 12:07 PM  Result Value Ref Range   ABO/RH(D) O POS    Antibody Screen NEG    Sample Expiration      02/11/2021,2359 Performed at [facility], [address], [place], [place]   Glucose, capillary     Status: None   Collection Time: 02/08/21 12:34 PM  Result Value Ref Range   Glucose-Capillary 93 70 - 99 mg/dL  Glucose, capillary     Status: None   Collection Time: 02/08/21  4:40 PM  Result Value Ref Range   Glucose-Capillary 87 70 - 99 mg/dL    Assessment / Plan: [redacted]w[redacted]d week IUP Labor: Early, progressing well. Now ROM'd. Titrate pitocin to achieve adequate labor.  Fetal Wellbeing:  Category I Pain Control:  Comfort measures Anticipated MOD:  SVD A2GDM: CBGs stable off meds.   [redacted]w[redacted]d, Katrinka Blazing, [place] 02/08/2021 4:58 PM

## 2021-02-08 NOTE — Anesthesia Procedure Notes (Signed)
Epidural Patient location during procedure: OB Start time: 02/08/2021 8:00 PM End time: 02/08/2021 8:08 PM  Staffing Anesthesiologist: Lannie Fields, DO Performed: anesthesiologist   Preanesthetic Checklist Completed: patient identified, IV checked, risks and benefits discussed, monitors and equipment checked, pre-op evaluation and timeout performed  Epidural Patient position: sitting Prep: DuraPrep and site prepped and draped Patient monitoring: continuous pulse ox, blood pressure, heart rate and cardiac monitor Approach: midline Location: L3-L4 Injection technique: LOR air  Needle:  Needle type: Tuohy  Needle gauge: 17 G Needle length: 9 cm Needle insertion depth: 5 cm Catheter type: closed end flexible Catheter size: 19 Gauge Catheter at skin depth: 10 cm Test dose: negative  Assessment Sensory level: T8 Events: blood not aspirated, injection not painful, no injection resistance, no paresthesia and negative IV test  Additional Notes Patient identified. Risks/Benefits/Options discussed with patient including but not limited to bleeding, infection, nerve damage, paralysis, failed block, incomplete pain control, headache, blood pressure changes, nausea, vomiting, reactions to medication both or allergic, itching and postpartum back pain. Confirmed with bedside nurse the patient's most recent platelet count. Confirmed with patient that they are not currently taking any anticoagulation, have any bleeding history or any family history of bleeding disorders. Patient expressed understanding and wished to proceed. All questions were answered. Sterile technique was used throughout the entire procedure. Please see nursing notes for vital signs. Test dose was given through epidural catheter and negative prior to continuing to dose epidural or start infusion. Warning signs of high block given to the patient including shortness of breath, tingling/numbness in hands, complete motor  block, or any concerning symptoms with instructions to call for help. Patient was given instructions on fall risk and not to get out of bed. All questions and concerns addressed with instructions to call with any issues or inadequate analgesia.  Reason for block:procedure for pain

## 2021-02-08 NOTE — Lactation Note (Deleted)
Lactation Consultation Note  Patient Name: Katherine Munoz [id number] Date: 02/08/2021 Reason for consult: L&D Initial assessment;Early term 39-38.6wks Age:25 y.o.  Mom holding infant in cradle hold.  She was actively sucking and swallowing.  Infant had a shallow latch so LC provided more pillow support under mom's arms and used a rolled blanket for under baby's head.  When infant came off the breast the nipple was pinched.    Mom was encouraged to wait until infant opened her mouth wide to bring her to the breast.  Infant latched deeply in cross cradle, cheeks were flush to breast, mom felt baby was latching well.  Mom was encouraged to feed with cues and hold baby STS.  LC will follow up with mom when she comes to [facility].  Maternal Data Has patient been taught Hand Expression?: Yes Does the patient have breastfeeding experience prior to this delivery?: Yes How long did the patient breastfeed?: 4 months with twins  Feeding Mother's Current Feeding Choice: Breast Milk  LATCH Score Latch: Grasps breast easily, tongue down, lips flanged, rhythmical sucking.  Audible Swallowing: A few with stimulation  Type of Nipple: Everted at rest and after stimulation  Comfort (Breast/Nipple): Soft / non-tender  Hold (Positioning): Assistance needed to correctly position infant at breast and maintain latch.  LATCH Score: 8   Lactation Tools Discussed/Used    Interventions Interventions: Support pillows;Adjust position;Assisted with latch;Skin to skin;Hand express  Discharge    Consult Status Consult Status: Follow-up Date: 02/09/21 Follow-up type: In-patient    Maryruth Hancock [facility] 02/08/2021, 9:55 PM

## 2021-02-09 LAB — GLUCOSE, CAPILLARY: Glucose-Capillary: 85 mg/dL (ref 70–99)

## 2021-02-09 LAB — RPR: RPR Ser Ql: NONREACTIVE

## 2021-02-09 MED ORDER — IBUPROFEN 600 MG PO TABS: 600.0000 mg | ORAL_TABLET | Freq: Four times a day (QID) | ORAL | 0 refills | Status: DC

## 2021-02-09 MED ORDER — ACETAMINOPHEN 325 MG PO TABS: 650.0000 mg | ORAL_TABLET | ORAL | 0 refills | Status: DC | PRN

## 2021-02-09 MED ORDER — POLYETHYLENE GLYCOL 3350 17 G PO PACK
17.0000 g | PACK | Freq: Every day | ORAL | Status: DC
  Administered 2021-02-09: 17 g via ORAL
  Filled 2021-02-09: qty 1

## 2021-02-09 NOTE — Progress Notes (Signed)
POSTPARTUM PROGRESS NOTE  Subjective: Katherine Munoz is a 25 y.o. S3M1962 PPD#1 s/p SVD at [redacted]w[redacted]d.  She reports she doing well. No acute events overnight. She denies any problems with ambulating, voiding or po intake. Denies nausea or vomiting. She has  passed flatus. Pain is well controlled.  Lochia is scant.  Considering discharge at 24 hrs (2058 on 10/3) if baby discharges. Otherwise will want to stay till tomorrow.  Objective: Blood pressure 114/75, pulse 79, temperature 98.3 F (36.8 C), temperature source Oral, resp. rate 16, height 5\' 1"  (1.549 m), weight 78.5 kg, last menstrual period 05/23/2020, SpO2 98 %, unknown if currently breastfeeding.  Physical Exam:  General: alert, cooperative and no distress Chest: no respiratory distress Abdomen: soft, non-tender  Uterine Fundus: firm, appropriately tender Extremities: No calf swelling or tenderness  no edema  Recent Labs    02/08/21 1207  HGB 11.2*  HCT 34.7*    Assessment/Plan: Katherine Munoz is a 25 y.o. 30 PPD#1 s/p SVD at [redacted]w[redacted]d for IOL for cholestasis.  Routine Postpartum Care: Doing well, pain well-controlled.  -- Continue routine care, lactation support  -- Contraception: declines -- Feeding: Breast  Dispo: Plan for discharge on PPD#2 unless baby discharges today  [redacted]w[redacted]d, MD, MPH OB Fellow, [facility] for [facility]

## 2021-02-09 NOTE — Discharge Summary (Signed)
Postpartum Discharge Summary    Patient Name: Katherine Munoz DOB: Feb 18, 1996 MRN: [id number]  Date of admission: 02/08/2021 Delivery date:02/08/2021  Delivering provider: Renard Matter  Date of discharge: 02/09/2021  Admitting diagnosis: Cholestasis during pregnancy [O26.619, K83.1] Intrauterine pregnancy: [redacted]w[redacted]d     Secondary diagnosis:  Active Problems:   Supervision of low-risk first pregnancy, first trimester   History of gestational diabetes   Intrahepatic cholestasis of pregnancy, antepartum, third trimester   Cholestasis during pregnancy   Vaginal delivery  Additional problems: None    Discharge diagnosis: Term Pregnancy Delivered and GDM A2                                              Post partum procedures: None Augmentation: Pitocin Complications: None  Hospital course: Induction of Labor With Vaginal Delivery   25 y.o. yo [id number] at [redacted]w[redacted]d was admitted to the hospital 02/08/2021 for induction of labor.  Indication for induction: Cholestasis of pregnancy.  Patient had an uncomplicated labor course as follows: Membrane Rupture Time/Date: 4:57 PM ,02/08/2021   Delivery Method:Vaginal, Spontaneous  Episiotomy: None  Lacerations:  Labial  Details of delivery can be found in separate delivery note.  Patient had a routine postpartum course. Patient is discharged home 02/09/21.  Newborn Data: Birth date:02/08/2021  Birth time:8:58 PM  Gender:Female  Living status:Living  Apgars:8 ,9  Weight:3325 g   Magnesium Sulfate received: No BMZ received: No Rhophylac:N/A MMR:N/A T-DaP:Given prenatally Flu: N/A Transfusion:No  Physical exam  Vitals:   02/09/21 0410 02/09/21 0915 02/09/21 1352 02/09/21 2115  BP: 114/75 106/79 118/64 107/78  Pulse: 79 90 92 83  Resp: $Remo'16 18 18 18  'EkeBW$ Temp: 98.3 F (36.8 C) 98.2 F (36.8 C) 98.1 F (36.7 C) 98.7 F (37.1 C)  TempSrc: Oral Oral Oral Oral  SpO2: 98% 98%    Weight:      Height:       General: alert Lochia:  appropriate Uterine Fundus: firm DVT Evaluation: No evidence of DVT seen on physical exam. Labs: Lab Results  Component Value Date   WBC 12.1 (H) 02/08/2021   HGB 11.2 (L) 02/08/2021   HCT 34.7 (L) 02/08/2021   MCV 84.2 02/08/2021   PLT 207 02/08/2021   CMP Latest Ref Rng & Units 12/30/2020  Glucose 65 - 139 mg/dL 60(L)  BUN 7 - 25 mg/dL 7  Creatinine 0.50 - 0.96 mg/dL 0.56  Sodium 135 - 146 mmol/L 135  Potassium 3.5 - 5.3 mmol/L 4.0  Chloride 98 - 110 mmol/L 104  CO2 20 - 32 mmol/L 23  Calcium 8.6 - 10.2 mg/dL 8.8  Total Protein 6.1 - 8.1 g/dL 6.4  Total Bilirubin 0.2 - 1.2 mg/dL 0.3  Alkaline Phos 38 - 126 U/L -  AST 10 - 30 U/L 12  ALT 6 - 29 U/L 7   Edinburgh Score: Edinburgh Postnatal Depression Scale Screening Tool 02/09/2021  I have been able to laugh and see the funny side of things. 0  I have looked forward with enjoyment to things. 0  I have blamed myself unnecessarily when things went wrong. 0  I have been anxious or worried for no good reason. 0  I have felt scared or panicky for no good reason. 0  Things have been getting on top of me. 0  I have been so unhappy that I have had difficulty  sleeping. 0  I have felt sad or miserable. 0  I have been so unhappy that I have been crying. 0  The thought of harming myself has occurred to me. 0  Edinburgh Postnatal Depression Scale Total 0     After visit meds:  Allergies as of 02/09/2021   No Known Allergies      Medication List     STOP taking these medications    Accu-Chek Guide test strip Generic drug: glucose blood   Accu-Chek Softclix Lancets lancets   aspirin 81 MG chewable tablet   metFORMIN 500 MG tablet Commonly known as: Glucophage   ursodiol 300 MG capsule Commonly known as: ACTIGALL       TAKE these medications    acetaminophen 325 MG tablet Commonly known as: Tylenol Take 2 tablets (650 mg total) by mouth every 4 (four) hours as needed (for pain scale < 4).   hydrOXYzine 25 MG  capsule Commonly known as: Vistaril Take 1 capsule (25 mg total) by mouth 3 (three) times daily as needed.   ibuprofen 600 MG tablet Commonly known as: ADVIL Take 1 tablet (600 mg total) by mouth every 6 (six) hours. Start taking on: February 10, 2021   PRENATAL 19 PO Take by mouth.         Discharge home in stable condition Infant Feeding: Breast Infant Disposition:home with mother Discharge instruction: per After Visit Summary and Postpartum booklet. Activity: Advance as tolerated. Pelvic rest for 6 weeks.  Diet: routine diet Future Appointments: Future Appointments  Date Time Provider [place]  03/09/2021  9:30 AM Guss Bunde, MD [facility]   Follow up Visit: Message sent to [facility] on 10/3 by Dr. Cy Blamer Please schedule this patient for a In person postpartum visit in 4 weeks with the following provider: Any provider. Additional Postpartum F/U:2 hour GTT  Low risk pregnancy complicated by: GDM Delivery mode:  Vaginal, Spontaneous  Anticipated Birth Control:   declines, would like to discuss more at [facility] visit   Renard Matter, MD, MPH OB Fellow, [facility]

## 2021-02-09 NOTE — Anesthesia Postprocedure Evaluation (Signed)
Anesthesia Post Note  Patient: Katherine Munoz  Procedure(s) Performed: AN AD HOC LABOR EPIDURAL     Patient location during evaluation: Mother Baby Anesthesia Type: Epidural Level of consciousness: awake, oriented and awake and alert Pain management: pain level controlled Vital Signs Assessment: post-procedure vital signs reviewed and stable Respiratory status: spontaneous breathing, respiratory function stable and nonlabored ventilation Cardiovascular status: stable Postop Assessment: no headache, adequate PO intake, able to ambulate, patient able to bend at knees, no backache and no apparent nausea or vomiting Anesthetic complications: no   No notable events documented.  Last Vitals:  Vitals:   02/09/21 0410 02/09/21 0915  BP: 114/75 106/79  Pulse: 79 90  Resp: 16 18  Temp: 36.8 C 36.8 C  SpO2: 98% 98%    Last Pain:  Vitals:   02/09/21 0915  TempSrc: Oral  PainSc:    Pain Goal:                   Lenore Moyano

## 2021-02-09 NOTE — Lactation Note (Signed)
This note was copied from a baby's chart. Lactation Consultation Note  Patient Name: Katherine Munoz [id number] Date: 02/09/2021 Reason for consult: Initial assessment;Mother's request;Early term 37-38.6wks;Maternal endocrine disorder;Nipple pain/trauma Age:26 hours  LC assisted with getting a deeper latch. Compression stripes and bruising noted. Mom to use EBM for nipple care.  Plan 1. To feed based on cues 8-12x 24 hr period. Mom to offer breast with compression and note signs of milk transfer 2. If unable to get a latch, Mom to offer eBM via spoon.  3. I and O sheet reviewed.  All question answered at the end of the visit.   Maternal Data Has patient been taught Hand Expression?: Yes Does the patient have breastfeeding experience prior to this delivery?: Yes How long did the patient breastfeed?: 4 months with multiples (twins)  Feeding Mother's Current Feeding Choice: Breast Milk  LATCH Score Latch: Repeated attempts needed to sustain latch, nipple held in mouth throughout feeding, stimulation needed to elicit sucking reflex.  Audible Swallowing: Spontaneous and intermittent  Type of Nipple: Everted at rest and after stimulation  Comfort (Breast/Nipple): Soft / non-tender  Hold (Positioning): Assistance needed to correctly position infant at breast and maintain latch.  LATCH Score: 8   Lactation Tools Discussed/Used    Interventions Interventions: Breast feeding basics reviewed;Assisted with latch;Skin to skin;Breast massage;Hand express;Breast compression;Adjust position;Support pillows;Position options;Expressed milk;Education;LC Services brochure  Discharge Pump: Personal WIC Program: No  Consult Status Consult Status: Follow-up Date: 02/10/21 Follow-up type: In-patient    Katherine Maulden  Munoz 02/09/2021, 12:00 PM

## 2021-02-18 ENCOUNTER — Telehealth (HOSPITAL_COMMUNITY): Payer: Self-pay | Admitting: *Deleted

## 2021-02-18 NOTE — Telephone Encounter (Signed)
Patient voiced no questions or concerns regarding her own health. EPDS = 1. Patient voiced no questions or concerns regarding baby at this time. Patient reported infant sleeps in a bassinet on her back. RN reviewed ABCs of safe sleep - patient verbalized understanding. Patient requested RN email information on hospital's virtual breastfeeding and pumping support groups. Email sent. Deforest Hoyles, RN, 02/18/21, [phone]

## 2021-03-02 ENCOUNTER — Other Ambulatory Visit: Payer: Self-pay

## 2021-03-02 ENCOUNTER — Encounter: Payer: Self-pay | Admitting: Obstetrics and Gynecology

## 2021-03-02 ENCOUNTER — Ambulatory Visit (INDEPENDENT_AMBULATORY_CARE_PROVIDER_SITE_OTHER): Payer: Medicaid Other | Admitting: Obstetrics and Gynecology

## 2021-03-02 VITALS — BP 111/71 | HR 63 | Ht 61.0 in | Wt 152.0 lb

## 2021-03-02 DIAGNOSIS — S01512S Laceration without foreign body of oral cavity, sequela: Secondary | ICD-10-CM

## 2021-03-02 NOTE — Progress Notes (Signed)
25 yo G3P2013 here for evaluation of labia laceration following vaginal delivery on 02/08/2021. Patient reports noticing a hole near the site of laceration repair. She denies any pain or discomfort. She has not been sexually active. She reports receiving ample assistance from husband with newborn care. She is breastfeeding without concerns. She denies any vaginal bleeding  Past Medical History:  Diagnosis Date   Cholestasis during pregnancy    Gestational diabetes    IBS (irritable bowel syndrome)    Medical history non-contributory    Scoliosis    Past Surgical History:  Procedure Laterality Date   TONSILLECTOMY     WISDOM TOOTH EXTRACTION     Family History  Problem Relation Age of Onset   Heart disease Maternal Grandfather    Breast cancer Other    Social History   Tobacco Use   Smoking status: Never   Smokeless tobacco: Never  Vaping Use   Vaping Use: Never used  Substance Use Topics   Alcohol use: Yes    Comment: Socially   Drug use: No   ROS See pertinent in HPI. All other systems reviewed and non contributory  Blood pressure 111/71, pulse 63, height 5\' 1"  (1.549 m), weight 152 lb (68.9 kg), last menstrual period 05/23/2020, currently breastfeeding. GENERAL: Well-developed, well-nourished female in no acute distress.  PELVIC: Normal external female genitalia with a 0.5 cm right labia minora central defect. Vagina is pink and rugated.  Normal discharge. Chaperone present during the pelvic exam EXTREMITIES: No cyanosis, clubbing, or edema, 2+ distal pulses.  A/P 25 yo with right labia minora central laceration defect - Skin edges re-approximated with 4.0 Vicryl following the injection of 3 cc of lidocaine - Patient tolerated the procedure well - Perineal care instructions provided - patient scheduled to return on 10/31 for postpartum visit with glucola. Patient was reminded to come in fasting

## 2021-03-06 NOTE — Progress Notes (Signed)
Post Partum Visit Note  Katherine Munoz is a 25 y.o. G66P2013 female who presents for a postpartum visit. She is 4 weeks postpartum following a normal spontaneous vaginal delivery- IOL at 37 weeks due to Cholestasis.  I have fully reviewed the prenatal and intrapartum course. The delivery was at 37.3 gestational weeks.  Anesthesia: epidural. Postpartum course has been unremarkable. Baby is doing well. Baby is feeding by breast. Bleeding no bleeding. Bowel function is normal. Bladder function is normal. Patient is not sexually active. Contraception method is none. Postpartum depression screening: negative.   The pregnancy intention screening data noted above was reviewed. Potential methods of contraception were discussed. The patient elected to proceed with No data recorded.    Health Maintenance Due  Topic Date Due   Pneumococcal Vaccine 48-55 Years old (1 - PCV) Never done   FOOT EXAM  Never done   OPHTHALMOLOGY EXAM  Never done   URINE MICROALBUMIN  Never done   HPV VACCINES (1 - 2-dose series) Never done   INFLUENZA VACCINE  12/08/2020   HEMOGLOBIN A1C  01/30/2021    The following portions of the patient's history were reviewed and updated as appropriate: allergies, current medications, past family history, past medical history, past social history, past surgical history, and problem list.  Review of Systems Pertinent items noted in HPI and remainder of comprehensive ROS otherwise negative.  Objective:  There were no vitals taken for this visit.   General:  alert, cooperative, and no distress   Breasts:  normal  Lungs: clear to auscultation bilaterally  Heart:  regular rate and rhythm  Abdomen: soft, non-tender; bowel sounds normal; no masses,  no organomegaly   Wound Right labia minora split in a key hole fashio.  The outermost edge is tenuous.    GU exam:   Labia minor as above; Pain over vestibular glands; increase tension and pain in lower vaginal wall       Assessment:     Healthy pp exam Labia minora tear--patient states it tore with the birth of her twins in the same area.  Plan:   Essential components of care per ACOG recommendations:  1.  Mood and well being: Patient with negative depression screening today. Reviewed local resources for support.  - Patient tobacco use? No.   - hx of drug use? No.    2. Infant care and feeding:  -Patient currently breastmilk feeding? Yes. Reviewed importance of draining breast regularly to support lactation.   3. Sexuality, contraception and birth spacing - Patient does not want a pregnancy in the next year.  Patient and husband use coitus interruptus and feels comfortable using that method going forward.  She declines other birth control methods at this time.  4. Sleep and fatigue -Encouraged family/partner/community support of 4 hrs of uninterrupted sleep to help with mood and fatigue  5. Physical Recovery  - Discussed patients delivery and complications.  I would hold off on intercourse for another week or 2 to see if right labia minora will finish coming together.  I think this is unlikely as the edges have epithelialized in the center of the key hole tear.   -Patient having perineal and vaginal sidewall pain.  We will refer her to physical therapy with Eulis Foster or counterpart at [facility].  6.  Health Maintenance - Last pap smear 07/2020 and normal Diagnosis  Date Value Ref Range Status  07/30/2020   Final   - Negative for intraepithelial lesion or  malignancy (NILM)   Pap smear not done at today's visit.  -Breast Cancer screening indicated? No.   7. Chronic Disease/Pregnancy Condition follow up: None  8.  2 hr gtt today  - RTC 4 weeks.   Elsie Lincoln, MD  Center for [employer], [facility]

## 2021-03-09 ENCOUNTER — Ambulatory Visit (INDEPENDENT_AMBULATORY_CARE_PROVIDER_SITE_OTHER): Payer: Medicaid Other | Admitting: Obstetrics & Gynecology

## 2021-03-09 ENCOUNTER — Encounter: Payer: Self-pay | Admitting: Obstetrics & Gynecology

## 2021-03-09 ENCOUNTER — Other Ambulatory Visit: Payer: Self-pay

## 2021-03-09 VITALS — BP 108/68 | HR 69 | Resp 16 | Ht 61.0 in | Wt 154.0 lb

## 2021-03-09 DIAGNOSIS — R102 Pelvic and perineal pain: Secondary | ICD-10-CM | POA: Diagnosis not present

## 2021-03-09 DIAGNOSIS — S01512S Laceration without foreign body of oral cavity, sequela: Secondary | ICD-10-CM

## 2021-03-09 DIAGNOSIS — Z8719 Personal history of other diseases of the digestive system: Secondary | ICD-10-CM | POA: Insufficient documentation

## 2021-03-09 DIAGNOSIS — O24419 Gestational diabetes mellitus in pregnancy, unspecified control: Secondary | ICD-10-CM

## 2021-03-09 DIAGNOSIS — Z8759 Personal history of other complications of pregnancy, childbirth and the puerperium: Secondary | ICD-10-CM

## 2021-03-09 DIAGNOSIS — Z8632 Personal history of gestational diabetes: Secondary | ICD-10-CM | POA: Diagnosis not present

## 2021-03-10 LAB — 2HR GTT W 1 HR, CARPENTER, 75 G
Glucose, 1 Hr, Gest: 97 mg/dL (ref 65–179)
Glucose, 2 Hr, Gest: 88 mg/dL (ref 65–152)
Glucose, Fasting, Gest: 79 mg/dL (ref 65–91)

## 2021-03-12 ENCOUNTER — Encounter: Payer: Self-pay | Admitting: Physical Therapy

## 2021-03-12 ENCOUNTER — Other Ambulatory Visit: Payer: Self-pay

## 2021-03-12 ENCOUNTER — Ambulatory Visit: Payer: Medicaid Other | Attending: Obstetrics & Gynecology | Admitting: Physical Therapy

## 2021-03-12 DIAGNOSIS — R279 Unspecified lack of coordination: Secondary | ICD-10-CM | POA: Insufficient documentation

## 2021-03-12 DIAGNOSIS — M6281 Muscle weakness (generalized): Secondary | ICD-10-CM | POA: Diagnosis present

## 2021-03-12 DIAGNOSIS — R102 Pelvic and perineal pain: Secondary | ICD-10-CM | POA: Diagnosis not present

## 2021-03-12 NOTE — Therapy (Signed)
[facility] [address] [place], [place], [address] Phone: [phone]   Fax:  [phone]  Physical Therapy Evaluation  Patient Details  Name: Katherine Munoz MRN: [id number] Date of Birth: 08-Mar-1996 Referring Provider (PT): Lesly Dukes, MD   Encounter Date: 03/12/2021   PT End of Session - 03/12/21 1614     Visit Number 1    Date for PT Re-Evaluation 06/12/21    Authorization Type [place] MEDICA[employer]PAID HEALTH PLAN    PT Start Time 1536   pt arrival time   PT Stop Time 1612    PT Time Calculation (min) 36 min    Activity Tolerance Patient tolerated treatment well    Behavior During Therapy [facility] for tasks assessed/performed             Past Medical History:  Diagnosis Date   Cholestasis during pregnancy    Gestational diabetes    IBS (irritable bowel syndrome)    Medical history non-contributory    Scoliosis     Past Surgical History:  Procedure Laterality Date   TONSILLECTOMY     WISDOM TOOTH EXTRACTION      There were no vitals filed for this visit.    Subjective Assessment - 03/12/21 1540     Subjective Pt reports she is currently 4 weeks postpartum with third baby, second pregnancy twins at home 5yo. Pt reports she had tearing at labia minora with this birth and needed to return to second stitching one week ago as the tear was not healing well, and now is still not healing well and considering cosmetic surgery. Pt reports pain with palpation during postpartum care visit but this was all. Pt does reports 5 year history of pain right before having a BM and then goes away after completing, has 1 BM per day to every other day, no blood usually but does have a hemorrhoid since first pregnancy that may bleed sometimes, but usually. Pt reports she was cleared to do exercises and attempted one mile run and had increased abdominal pain/cramping and sorness.    Pertinent History twins 5 years ago vaginally with  labia minor tearing as well, 4wk postpartum vaginal birth with tearing    How long can you sit comfortably? no limits    How long can you stand comfortably? no limits    How long can you walk comfortably? no limits    Patient Stated Goals to return to running postpartum    Currently in Pain? No/denies                [facility] PT Assessment - 03/12/21 0001       Assessment   Medical Diagnosis R10.2 (ICD-10-CM) - Pelvic pain    Referring Provider (PT) Lesly Dukes, MD    Onset Date/Surgical Date --   4 weeks ago   Prior Therapy no      Precautions   Precautions Other (comment)   postpartum     Restrictions   Weight Bearing Restrictions No      Balance Screen   Has the patient fallen in the past 6 months No    Has the patient had a decrease in activity level because of a fear of falling?  No    Is the patient reluctant to leave their home because of a fear of falling?  No      Home Nurse, mental health Private residence    Living Arrangements Spouse/significant other;Children  Prior Function   Level of Independence Independent      Cognition   Overall Cognitive Status Within Functional Limits for tasks assessed      Sensation   Light Touch Appears Intact      Coordination   Gross Motor Movements are Fluid and Coordinated Yes    Fine Motor Movements are Fluid and Coordinated Yes      Posture/Postural Control   Posture/Postural Control Postural limitations    Postural Limitations Rounded Shoulders;Posterior pelvic tilt      ROM / Strength   AROM / PROM / Strength AROM;Strength      AROM   Overall AROM Comments WFL      Strength   Overall Strength Comments bil hips 4+/5 no pain      Flexibility   Soft Tissue Assessment /Muscle Length yes   WFL     Palpation   Palpation comment mild TTP at lower left abdominal quadrant and upper Rt quadrant. denied all others and at pubic bone, LEs and pelvis and spine                 No  emotional/communication barriers or cognitive limitation. Patient is motivated to learn. Patient understands and agrees with treatment goals and plan. PT explains patient will be examined in standing, sitting, and lying down to see how their muscles and joints work. When they are ready, they will be asked to remove their underwear so PT can examine their perineum. The patient is also given the option of providing their own chaperone as one is not provided in our facility. The patient also has the right and is explained the right to defer or refuse any part of the evaluation or treatment including the internal exam. With the patient's consent, PT will use one gloved finger to gently assess the muscles of the pelvic floor, seeing how well it contracts and relaxes and if there is muscle symmetry. After, the patient will get dressed and PT and patient will discuss exam findings and plan of care. PT and patient discuss plan of care, schedule, attendance policy and HEP activities.        Objective measurements completed on examination: See above findings.     Pelvic Floor Special Questions - 03/12/21 0001     Prior Pelvic/Prostate Exam Yes   in the few years, normal   Are you Pregnant or attempting pregnancy? No    Currently Sexually Active No   4 weeks postpartum but was active prior to this prior to this pregnancy. Pain with deep pentration with intercourse   History of sexually transmitted disease No    Marinoff Scale discomfort that does not affect completion    Urinary Leakage No    Urinary urgency No    Urinary frequency once every 2 hours    Fecal incontinence No    Fluid intake yes 1/2 body weight in oz per day    Caffeine beverages yes- coffee in Am and afternoon    Falling out feeling (prolapse) No    External Perineal Exam sutures at Rt labia minor due to tear during delivery currently second stitches needed postpartum due to poor healing. but no pain per pt    Prolapse None    Pelvic  Floor Internal Exam patient identified and patient confirms consent for PT to perform internal soft tissue work and muscle strength and integrity assessment    Exam Type Vaginal    Sensation WFL    Palpation no TTP  Strength fair squeeze, definite lift    Strength # of reps 5    Strength # of seconds 2    Tone fair                       PT Education - 03/12/21 1614     Education Details Pt educated on exam findings, HEP, and breathing mechanics    Person(s) Educated Patient    Methods Demonstration;Explanation;Tactile cues;Verbal cues;Handout    Comprehension Verbalized understanding;Returned demonstration              PT Short Term Goals - 03/12/21 1717       PT SHORT TERM GOAL #1   Title Pt to be I with HEP    Time 4    Period Weeks    Status New    Target Date 04/09/21      PT SHORT TERM GOAL #2   Title pt to demonstrate at least 4/5 pelvic floor strength with ability to hold contraction for 5s to improve pelvic stability.    Time 5    Period Weeks    Status New    Target Date 04/16/21               PT Long Term Goals - 03/12/21 1718       PT LONG TERM GOAL #1   Title pt to be I with advanced HEP    Time 8    Period Weeks    Status New    Target Date 05/07/21      PT LONG TERM GOAL #2   Title demonstrate at least 5/5 pelvic floor strength with ability to hold contraction for 10s to improve pelvic stability    Time 8    Period Weeks    Status New    Target Date 05/07/21      PT LONG TERM GOAL #3   Title pt to report no more than 3/10 pain with vaginal penetration to improve tolerance to intercourse    Time 8    Period Weeks    Status New    Target Date 05/07/21      PT LONG TERM GOAL #4   Title pt to demonstrate improved coordination with breathing mechanics with pelvic floor contract/relax at least 75% of the time for decreased strain on pelvic floor with running.    Time 8    Period Weeks    Status New    Target Date  05/07/21                    Plan - 03/12/21 1615     Clinical Impression Statement Pt is 24yo female, presents to clinic with referral at 4 weeks postpatum after first postpartum appointment and MD finding pain in vaginal assessment per pt and continued tearing at Rt labia minora from recent delivery. Pt reports no other symptoms postpartum, no other pelvic pain, no leakage of urine or feces, no prolapse symptoms, and has not attempt intercourse yet postpartum due to wound healing. Pt does report pt prior to pregnancy with intercourse with deep penetration but was told this was due to her being petite. Pt denied vaginal dryness or irritation and did attempt a one mile run after recent MD appointment one week ago and cleared for exercises however had lower abdominal cramping and soreness after this. Pt found to have internal pelvic floor weakness, decreased coordination and endurance but no pain per pt. Pt would benefit  from continued PT to address pelvic floor deficits and return to running training postpartum to decrease risk of injury at pelvic floor.    Personal Factors and Comorbidities Comorbidity 2;Fitness    Comorbidities x3 vaginal births with tearing    Examination-Participation Restrictions Community Activity;Interpersonal Relationship    Stability/Clinical Decision Making Stable/Uncomplicated    Clinical Decision Making Low    Rehab Potential Good    PT Frequency 1x / week    PT Duration 8 weeks    PT Treatment/Interventions ADLs/Self Care Home Management;Aquatic Therapy;Functional mobility training;Therapeutic activities;Therapeutic exercise;Neuromuscular re-education;Manual techniques;Patient/family education;Taping;Scar mobilization;Passive range of motion;Energy conservation    PT Next Visit Plan go over HEP and return to running, kegel training    PT Home Exercise Plan [facility]    Consulted and Agree with Plan of Care Patient             Patient will benefit from  skilled therapeutic intervention in order to improve the following deficits and impairments:  Pain, Decreased strength, Decreased mobility, Decreased scar mobility, Improper body mechanics, Decreased endurance, Decreased coordination  Visit Diagnosis: Muscle weakness (generalized) - Plan: PT plan of care cert/re-cert  Lack of coordination - Plan: PT plan of care cert/re-cert     Problem List Patient Active Problem List   Diagnosis Date Noted   History of cholestasis during pregnancy 03/09/2021   History of gestational diabetes 08/01/2020   Lactose intolerance in adult 12/19/2017   Environmental and seasonal allergies 12/19/2017    Otelia Sergeant, PT, DPT 11/03/225:25 PM   [facility] @ [facility] [address] [place], [place], [address] Phone: [phone]   Fax:  [phone]  Name: Katherine Munoz MRN: [id number] Date of Birth: 11-09-1995

## 2021-03-12 NOTE — Patient Instructions (Signed)
Access Code: [email or web address] URL: [email or web address] Date: 03/12/2021 Prepared by: [facility]  Exercises Supine Diaphragmatic Breathing - 1 x daily - 7 x weekly - 1 sets - 10 reps Hooklying Transversus Abdominis Palpation - 1 x daily - 7 x weekly - 1 sets - 10 reps Supine Pelvic Floor Contraction - 1 x daily - 7 x weekly - 2 sets - 10 reps Supine Thoracic Mobilization Foam Roll Horizontal with Arm Stretch - 1 x daily - 7 x weekly - 1 sets - 1-3 reps - 30-45s holds Supine Single Knee to Chest Stretch - 1 x daily - 7 x weekly - 1 sets - 1-3 reps - 30-45s holds Cat Cow - 1 x daily - 7 x weekly - 1 sets - 10 reps - 10s holds Sitting Wall Angels - 1 x daily - 7 x weekly - 1 sets - 10 reps - 10s holds

## 2021-03-23 ENCOUNTER — Ambulatory Visit (INDEPENDENT_AMBULATORY_CARE_PROVIDER_SITE_OTHER): Payer: Medicaid Other | Admitting: Obstetrics & Gynecology

## 2021-03-23 ENCOUNTER — Other Ambulatory Visit: Payer: Self-pay

## 2021-03-23 ENCOUNTER — Encounter: Payer: Self-pay | Admitting: Obstetrics & Gynecology

## 2021-03-23 VITALS — BP 116/81 | HR 77 | Ht 61.0 in | Wt 152.0 lb

## 2021-03-23 DIAGNOSIS — N94819 Vulvodynia, unspecified: Secondary | ICD-10-CM

## 2021-03-23 DIAGNOSIS — S3141XD Laceration without foreign body of vagina and vulva, subsequent encounter: Secondary | ICD-10-CM

## 2021-03-23 MED ORDER — LIDOCAINE 5 % EX OINT: 1.0000 "application " | TOPICAL_OINTMENT | CUTANEOUS | 1 refills | Status: DC | PRN

## 2021-03-23 NOTE — Progress Notes (Signed)
   Subjective:    Patient ID: Katherine Munoz, female    DOB: Nov 29, 1995, 25 y.o.   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  Patient feels irritated in her leggings.  She is hesitant to also have intercourse because of the pain with touching.  [name] did go to physical therapy once and received conflicting information about the need for physical therapy.  [name] stated she did say she was not having any pain.  Patient reports that she is having some vaginal spotting.  She is 6 weeks postpartum.  Review of Systems  Constitutional: Negative.   Respiratory: Negative.    Cardiovascular: Negative.   Gastrointestinal: Negative.   Genitourinary:  Positive for vaginal bleeding and vaginal pain.      Objective:   Physical Exam Vitals reviewed.  Constitutional:      General: She is not in acute distress.    Appearance: She is well-developed.  HENT:     Head: Normocephalic and atraumatic.  Eyes:     Conjunctiva/sclera: Conjunctivae normal.  Cardiovascular:     Rate and Rhythm: Normal rate.  Pulmonary:     Effort: Pulmonary effort is normal.  Abdominal:     General: Abdomen is flat.     Palpations: Abdomen is soft.     Tenderness: There is no abdominal tenderness.  Genitourinary:      Comments: Patient also reports pain on exam bilaterally when pelvic diaphragm is palpated. Skin:    General: Skin is warm and dry.  Neurological:     Mental Status: She is alert and oriented to person, place, and time.  Psychiatric:        Mood and Affect: Mood normal.  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  Lidocaine ointment for symptom pain.  We discussed lubrication during intercourse.  Patient is very worried to  have intercourse.  Husband is understanding although also anxious to have resumed sexual activity. Will send note to physical therapist to make sure we are on the same page about need for physical therapy.  [name]'s exam is textbook for vulvodynia that can occur after a traumatic obstetrical laceration. Patient return to clinic in 8 to 12 weeks for follow-up of progress with physical therapy.  Is still having pain we can add on a oral medication to help with chronic pain.  30 minutes was spent with the patient during the physical exam and evaluation, removal of suture, review of medical records, coordination of care, and documentation.

## 2021-04-03 ENCOUNTER — Encounter: Payer: Self-pay | Admitting: Physical Therapy

## 2021-04-06 ENCOUNTER — Encounter: Payer: Medicaid Other | Admitting: Physical Therapy

## 2021-04-09 ENCOUNTER — Other Ambulatory Visit: Payer: Self-pay

## 2021-04-09 ENCOUNTER — Ambulatory Visit: Payer: Medicaid Other | Attending: Obstetrics & Gynecology | Admitting: Physical Therapy

## 2021-04-09 DIAGNOSIS — R279 Unspecified lack of coordination: Secondary | ICD-10-CM | POA: Diagnosis present

## 2021-04-09 DIAGNOSIS — M6281 Muscle weakness (generalized): Secondary | ICD-10-CM | POA: Diagnosis not present

## 2021-04-09 NOTE — Therapy (Signed)
[facility] @ [facility] [address] [place], [place], [address] Phone: [phone]   Fax:  [phone]  Physical Therapy Treatment  Patient Details  Name: Katherine Munoz MRN: [id number] Date of Birth: 10-08-1995 Referring Provider (PT): Lesly Dukes, MD   Encounter Date: 04/09/2021   PT End of Session - 04/09/21 0848     Visit Number 2    Date for PT Re-Evaluation 06/12/21    Authorization Type [place] MEDICAID PREPAID HEALTH PLAN    PT Start Time [phone]   pt arrival time   PT Stop Time 0845    PT Time Calculation (min) 35 min    Activity Tolerance Patient tolerated treatment well    Behavior During Therapy [facility] for tasks assessed/performed             Past Medical History:  Diagnosis Date   Cholestasis during pregnancy    Gestational diabetes    IBS (irritable bowel syndrome)    Medical history non-contributory    Scoliosis     Past Surgical History:  Procedure Laterality Date   TONSILLECTOMY     WISDOM TOOTH EXTRACTION      There were no vitals filed for this visit.                      [facility] Adult PT Treatment/Exercise - 04/09/21 0001       Self-Care   Self-Care Other Self-Care Comments    Other Self-Care Comments  urge drill, gentle stretching at perineal body for decreased pain, continued HEP and breathing mechanics with activity to decrease stain at pelvic floor.      Exercises   Exercises Lumbar;Knee/Hip      Lumbar Exercises: Standing   Other Standing Lumbar Exercises palloffs x10 red band, rotation palloffs x10 red band    Other Standing Lumbar Exercises pelvic tilts against ball on wall x10      Lumbar Exercises: Supine   Bridge 20 reps    Bridge Limitations blue band resistance with adductions x10 each leg    Other Supine Lumbar Exercises x10 TA activations with tactile cues to complete                     PT Education - 04/09/21 0848     Education Details Pt  educated on urge drill, core activations, breathing mechanics, HEP and perineal body massage    Person(s) Educated Patient    Methods Explanation;Demonstration;Tactile cues;Verbal cues;Handout    Comprehension Verbalized understanding;Returned demonstration              PT Short Term Goals - 03/12/21 1717       PT SHORT TERM GOAL #1   Title Pt to be I with HEP    Time 4    Period Weeks    Status New    Target Date 04/09/21      PT SHORT TERM GOAL #2   Title pt to demonstrate at least 4/5 pelvic floor strength with ability to hold contraction for 5s to improve pelvic stability.    Time 5    Period Weeks    Status New    Target Date 04/16/21               PT Long Term Goals - 03/12/21 1718       PT LONG TERM GOAL #1   Title pt to be I with advanced HEP    Time 8  Period Weeks    Status New    Target Date 05/07/21      PT LONG TERM GOAL #2   Title demonstrate at least 5/5 pelvic floor strength with ability to hold contraction for 10s to improve pelvic stability    Time 8    Period Weeks    Status New    Target Date 05/07/21      PT LONG TERM GOAL #3   Title pt to report no more than 3/10 pain with vaginal penetration to improve tolerance to intercourse    Time 8    Period Weeks    Status New    Target Date 05/07/21      PT LONG TERM GOAL #4   Title pt to demonstrate improved coordination with breathing mechanics with pelvic floor contract/relax at least 75% of the time for decreased strain on pelvic floor with running.    Time 8    Period Weeks    Status New    Target Date 05/07/21                   Plan - 04/09/21 0849     Clinical Impression Statement Pt presents to clinic reporting she has had loss of bladder without warning or urge to urinate, has had continued pelvic pain at scar site and at perienal body. Pt educated on breathing mechanics with all activity during session, urge drill, perineal body massage/stretching gently in all  directions as tolerated, and TA activations and the knack. Pt session focused on this and core/hip strengthening with improved breathing pattern to decrease strain at pelvic floor and improve core activations. Pt demonstred need of cues for all exercises and had difficulty bringing pelvis to neutral in tilted posture a lot of the time, standing in anterior pelvic tilt excessively and with holding children demonstrated posterior pelvic tilt. Pt improved with cues and mirror feedback. Pt tolerated well and denied pain. Pt would benefit from additional PT to further address deficits found in evaluation.    Personal Factors and Comorbidities Comorbidity 2;Fitness    Comorbidities x3 vaginal births with tearing    Examination-Participation Restrictions Community Activity;Interpersonal Relationship    Stability/Clinical Decision Making Stable/Uncomplicated    Rehab Potential Good    PT Frequency 1x / week    PT Duration 8 weeks    PT Treatment/Interventions ADLs/Self Care Home Management;Aquatic Therapy;Functional mobility training;Therapeutic activities;Therapeutic exercise;Neuromuscular re-education;Manual techniques;Patient/family education;Taping;Scar mobilization;Passive range of motion;Energy conservation    PT Next Visit Plan return to running, kegel training    PT Home Exercise Plan [facility]    Consulted and Agree with Plan of Care Patient             Patient will benefit from skilled therapeutic intervention in order to improve the following deficits and impairments:  Pain, Decreased strength, Decreased mobility, Decreased scar mobility, Improper body mechanics, Decreased endurance, Decreased coordination  Visit Diagnosis: Muscle weakness (generalized)  Lack of coordination     Problem List Patient Active Problem List   Diagnosis Date Noted   History of cholestasis during pregnancy 03/09/2021   History of gestational diabetes 08/01/2020   Lactose intolerance in adult 12/19/2017    Environmental and seasonal allergies 12/19/2017    Otelia Sergeant, PT, DPT 12/01/228:54 AM   [facility] @ [facility] [address] [place], [place], [address] Phone: [phone]   Fax:  [phone]  Name: Alzena Gerber MRN: [id number] Date of Birth: 06-Jun-1995

## 2021-04-09 NOTE — Patient Instructions (Signed)
Urge Incontinence  Ideal urination frequency is every 2-4 wakeful hours, which equates to 5-8 times within a 24-hour period.   Urge incontinence is leakage that occurs when the bladder muscle contracts, creating a sudden need to go before getting to the bathroom.   Going too often when your bladder isn't actually full can disrupt the body's automatic signals to store and hold urine longer, which will increase urgency/frequency.  In this case, the bladder "is running the show" and strategies can be learned to retrain this pattern.   One should be able to control the first urge to urinate, at around 150mL.  The bladder can hold up to a "grande latte," or 400mL. To help you gain control, practice the Urge Drill below when urgency strikes.  This drill will help retrain your bladder signals and allow you to store and hold urine longer.  The overall goal is to stretch out your time between voids to reach a more manageable voiding schedule.    Practice your "quick flicks" often throughout the day (each waking hour) even when you don't need feel the urge to go.  This will help strengthen your pelvic floor muscles, making them more effective in controlling leakage.  Urge Drill  When you feel an urge to go, follow these steps to regain control: Stop what you are doing and be still Take one deep breath, directing your air into your abdomen Think an affirming thought, such as "I've got this." Do 5 quick flicks of your pelvic floor Walk with control to the bathroom to void, or delay voiding   Relaxation Exercises with the Urge to Void   When you experience an urge to void:  FIRST  Stop and stand very still    Sit down if you can    Don't move    You need to stay very still to maintain control  SECOND Squeeze your pelvic floor muscles 5 times, like a quick flick, to keep from leaking  THIRD Relax  Take a deep breath and then let it out  Try to make the urge go away by using relaxation and  visualization techniques  FINALLY When you feel the urge go away somewhat, walk normally to the bathroom.   If the urge gets suddenly stronger on the way, you may stop again and relax to regain control.  

## 2021-04-16 ENCOUNTER — Other Ambulatory Visit: Payer: Self-pay

## 2021-04-16 ENCOUNTER — Ambulatory Visit: Payer: Medicaid Other | Admitting: Physical Therapy

## 2021-04-16 DIAGNOSIS — R279 Unspecified lack of coordination: Secondary | ICD-10-CM

## 2021-04-16 DIAGNOSIS — M6281 Muscle weakness (generalized): Secondary | ICD-10-CM | POA: Diagnosis not present

## 2021-04-16 NOTE — Therapy (Addendum)
[facility] @ [place], [place], [address] Phone: [phone]   Fax:  [phone]  Physical Therapy Treatment  Patient Details  Name: Katherine Munoz MRN: [id number] Date of Birth: Jun 12, 1995 Referring Provider (PT): Guss Bunde, MD   Encounter Date: 04/16/2021   PT End of Session - 04/16/21 1213     Visit Number 3    Date for PT Re-Evaluation 06/12/21    Authorization Type [place] MEDICAID PREPAID HEALTH PLAN    PT Start Time 7341    PT Stop Time 1225    PT Time Calculation (min) 40 min    Activity Tolerance Patient tolerated treatment well    Behavior During Therapy W[facility] for tasks assessed/performed             Past Medical History:  Diagnosis Date   Cholestasis during pregnancy    Gestational diabetes    IBS (irritable bowel syndrome)    Medical history non-contributory    Scoliosis     Past Surgical History:  Procedure Laterality Date   TONSILLECTOMY     WISDOM TOOTH EXTRACTION      There were no vitals filed for this visit.   Subjective Assessment - 04/16/21 1149     Subjective Pt reports she reports urgency and leakage is a lot better, no pain with intercourse, no bleeding with mobility or working out. Pt reports no pain with palpation at tear site now too. Pt denies all leakage.    Pertinent History twins 5 years ago vaginally with labia minor tearing as well, 8wk postpartum vaginal birth with tearing    How long can you sit comfortably? no limits    How long can you stand comfortably? no limits    How long can you walk comfortably? no limits    Patient Stated Goals to return to running postpartum    Currently in Pain? No/denies                               [facility] Adult PT Treatment/Exercise - 04/16/21 0001       Exercises   Exercises Lumbar;Knee/Hip      Lumbar Exercises: Stretches   Other Lumbar Stretch Exercise childs pose  x30s; deep squat x45s    Other  Lumbar Stretch Exercise table top stretch toes neutral, toes in, toes out x30s each; angel wing 10x5s      Lumbar Exercises: Standing   Functional Squats 20 reps    Functional Squats Limitations squats with 2x5# BD    Other Standing Lumbar Exercises palloffs x10 red band; rotation palloffs red band x10    Other Standing Lumbar Exercises mario punches 2x5# BD      Lumbar Exercises: Supine   Pelvic Tilt 20 reps    Clam 20 reps    Clam Limitations red loop    Bridge 20 reps    Bridge Limitations red loop    Other Supine Lumbar Exercises dead bugs x10; opp arm/knee press with exhale x10 each    Other Supine Lumbar Exercises x10 TA activations with tactile cues to complete      Lumbar Exercises: Quadruped   Madcat/Old Horse 10 reps    Other Quadruped Lumbar Exercises needle threaders 2x30s each      Knee/Hip Exercises: Standing   Forward Lunges Both;10 reps    Side Lunges Both;10 reps  PT Education - 04/16/21 1212     Education Details Pt educated on updated HEP, breathing mechanics and core activations    Person(s) Educated Patient    Methods Explanation;Demonstration;Tactile cues;Verbal cues;Handout    Comprehension Verbalized understanding;Returned demonstration              PT Short Term Goals - 03/12/21 1717       PT SHORT TERM GOAL #1   Title Pt to be I with HEP    Time 4    Period Weeks    Status New    Target Date 04/09/21      PT SHORT TERM GOAL #2   Title pt to demonstrate at least 4/5 pelvic floor strength with ability to hold contraction for 5s to improve pelvic stability.    Time 5    Period Weeks    Status New    Target Date 04/16/21               PT Long Term Goals - 03/12/21 1718       PT LONG TERM GOAL #1   Title pt to be I with advanced HEP    Time 8    Period Weeks    Status New    Target Date 05/07/21      PT LONG TERM GOAL #2   Title demonstrate at least 5/5 pelvic floor strength with ability to  hold contraction for 10s to improve pelvic stability    Time 8    Period Weeks    Status New    Target Date 05/07/21      PT LONG TERM GOAL #3   Title pt to report no more than 3/10 pain with vaginal penetration to improve tolerance to intercourse    Time 8    Period Weeks    Status New    Target Date 05/07/21      PT LONG TERM GOAL #4   Title pt to demonstrate improved coordination with breathing mechanics with pelvic floor contract/relax at least 75% of the time for decreased strain on pelvic floor with running.    Time 8    Period Weeks    Status New    Target Date 05/07/21                   Plan - 04/16/21 1213     Clinical Impression Statement Pt presents to clinic reporting "I feel a lot better" pt reports no longer having pain with palpation at tear site, intercourse not painful, and no longer having bleeding with activity, also no urinary leakage. Pt reports all recommendations have really helped and would like to focus on core and hip retraining to return to running. Pt session focused on core activatio nand breathing mechanics with all activities and pelvic floor coordination. Pt tolerated well with rest break and cues for this. Pt would benefit from additional PT to further address deficits found in evaluation    Personal Factors and Comorbidities Comorbidity 2;Fitness    Comorbidities x3 vaginal births with tearing    Examination-Participation Restrictions Community Activity;Interpersonal Relationship    Stability/Clinical Decision Making Stable/Uncomplicated    Rehab Potential Good    PT Frequency 1x / week    PT Duration 8 weeks    PT Treatment/Interventions ADLs/Self Care Home Management;Aquatic Therapy;Functional mobility training;Therapeutic activities;Therapeutic exercise;Neuromuscular re-education;Manual techniques;Patient/family education;Taping;Scar mobilization;Passive range of motion;Energy conservation    PT Next Visit Plan return to running, kegel  training    PT Home  Exercise Plan [id number]    Consulted and Agree with Plan of Care Patient             Patient will benefit from skilled therapeutic intervention in order to improve the following deficits and impairments:  Pain, Decreased strength, Decreased mobility, Decreased scar mobility, Improper body mechanics, Decreased endurance, Decreased coordination  Visit Diagnosis: Muscle weakness (generalized)  Lack of coordination     Problem List Patient Active Problem List   Diagnosis Date Noted   History of cholestasis during pregnancy 03/09/2021   History of gestational diabetes 08/01/2020   Lactose intolerance in adult 12/19/2017   Environmental and seasonal allergies 12/19/2017   Stacy Gardner, PT, DPT 04/17/2211:25 PM    PHYSICAL THERAPY DISCHARGE SUMMARY  Visits from Start of Care: 3  Current functional level related to goals / functional outcomes: Unable to formally reassess, pt called today (04/23/22) reporting she is ready to end therapy, after last treatment (04/16/21).    Remaining deficits: Unable to formally reassess however pt requesting DC due to improvement in symptoms   Education / Equipment: HEP   Patient agrees to discharge. Patient goals were partially met. Patient is being discharged due to being pleased with the current functional level. Thank you for the referral.    [facility] @ [facility] [place], [place], [address] Phone: [phone]   Fax:  [phone]  Name: Cortne Amara MRN: [id number] Date of Birth: 07-07-1995

## 2021-04-23 ENCOUNTER — Encounter: Payer: Medicaid Other | Admitting: Physical Therapy

## 2021-04-30 ENCOUNTER — Encounter: Payer: Medicaid Other | Admitting: Physical Therapy

## 2021-05-06 ENCOUNTER — Telehealth: Payer: Self-pay | Admitting: *Deleted

## 2021-05-06 NOTE — Telephone Encounter (Signed)
Left patient a message to call and schedule 8 week F/U appointment if still needed with Dr. Penne Lash.

## 2021-06-18 ENCOUNTER — Encounter: Payer: Self-pay | Admitting: Obstetrics and Gynecology

## 2021-06-18 ENCOUNTER — Other Ambulatory Visit: Payer: Self-pay

## 2021-06-18 ENCOUNTER — Ambulatory Visit: Payer: Medicaid Other | Admitting: Obstetrics and Gynecology

## 2021-06-18 VITALS — BP 104/68 | HR 64 | Temp 98.3°F | Ht 61.0 in | Wt 146.0 lb

## 2021-06-18 DIAGNOSIS — N61 Mastitis without abscess: Secondary | ICD-10-CM

## 2021-06-18 MED ORDER — DICLOXACILLIN SODIUM 500 MG PO CAPS: 500.0000 mg | ORAL_CAPSULE | Freq: Four times a day (QID) | ORAL | 0 refills | Status: DC

## 2021-06-18 NOTE — Progress Notes (Signed)
26 yo P3 presenting for the evaluation of left breast pain. Patient reports onset of pain 2 days ago which has gotten progressively worst. She reports associated generalized ache and fatigue. She denies any fever. She reports some relief of her pain with warm compresses. She was bottle feeding but is now exclusively breastfeeding  Past Medical History:  Diagnosis Date   Cholestasis during pregnancy    Gestational diabetes    IBS (irritable bowel syndrome)    Medical history non-contributory    Scoliosis    Past Surgical History:  Procedure Laterality Date   TONSILLECTOMY     WISDOM TOOTH EXTRACTION     Family History  Problem Relation Age of Onset   Heart disease Maternal Grandfather    Breast cancer Other    Social History   Tobacco Use   Smoking status: Never   Smokeless tobacco: Never  Vaping Use   Vaping Use: Never used  Substance Use Topics   Alcohol use: Yes    Comment: Socially   Drug use: No   ROS See pertinent in HPI. All other systems reviewed and non contributory Blood pressure 104/68, pulse 64, temperature 98.3 F (36.8 C), height 5\' 1"  (1.549 m), weight 146 lb (66.2 kg), currently breastfeeding. GENERAL: Well-developed, well-nourished female in no acute distress.  BREASTS: Symmetric in size. No palpable masses or lymphadenopathy, skin changes, or nipple drainage. Left breast warm to touch without any palpable fluctuating mass ABDOMEN: Soft, nontender, nondistended. No organomegaly. EXTREMITIES: No cyanosis, clubbing, or edema, 2+ distal pulses.  A/P 26 yo with suspected left breast mastitis - Rx antibiotic provided - Precautions reviewed - RTC prn

## 2021-07-10 ENCOUNTER — Telehealth: Payer: Self-pay | Admitting: Obstetrics and Gynecology

## 2021-07-10 NOTE — Telephone Encounter (Signed)
Received a call from patient requesting to have a Rx sent in for birthcontrol pills. Was just seen, and does not need to come in the office. ?

## 2021-07-20 ENCOUNTER — Ambulatory Visit: Payer: Medicaid Other | Admitting: Family Medicine

## 2021-07-20 ENCOUNTER — Encounter: Payer: Self-pay | Admitting: Family Medicine

## 2021-07-20 ENCOUNTER — Other Ambulatory Visit: Payer: Self-pay

## 2021-07-20 VITALS — BP 115/77 | HR 80 | Ht 61.0 in | Wt 143.0 lb

## 2021-07-20 DIAGNOSIS — Z30011 Encounter for initial prescription of contraceptive pills: Secondary | ICD-10-CM

## 2021-07-20 MED ORDER — NORETHINDRONE 0.35 MG PO TABS: 1.0000 | ORAL_TABLET | Freq: Every day | ORAL | 3 refills | Status: DC

## 2021-07-20 NOTE — Progress Notes (Signed)
? ?  Subjective:  ? ? Patient ID: Katherine Munoz is a 26 y.o. female presenting with Discuss Birth Control ? on 07/20/2021 ? ?HPI: ?Reports that her cycles are heavy. She has trouble leaving the house due to them. She continues to breast feed. Began cycle in last 7 days. Was on COCs in high school and college and worked well for her. ? ?Review of Systems  ?Constitutional:  Negative for chills and fever.  ?Respiratory:  Negative for shortness of breath.   ?Cardiovascular:  Negative for chest pain.  ?Gastrointestinal:  Negative for abdominal pain, nausea and vomiting.  ?Genitourinary:  Negative for dysuria.  ?Skin:  Negative for rash.  ?   ?Objective:  ?  ?BP 115/77   Pulse 80   Ht 5\' 1"  (1.549 m)   Wt 143 lb (64.9 kg)   LMP 07/16/2021   Breastfeeding Yes   BMI 27.02 kg/m?  ?Physical Exam ?Exam conducted with a chaperone present.  ?Constitutional:   ?   General: She is not in acute distress. ?   Appearance: She is well-developed.  ?HENT:  ?   Head: Normocephalic and atraumatic.  ?Eyes:  ?   General: No scleral icterus. ?Cardiovascular:  ?   Rate and Rhythm: Normal rate.  ?Pulmonary:  ?   Effort: Pulmonary effort is normal.  ?Abdominal:  ?   Palpations: Abdomen is soft.  ?Musculoskeletal:  ?   Cervical back: Neck supple.  ?Skin: ?   General: Skin is warm and dry.  ?Neurological:  ?   Mental Status: She is alert and oriented to person, place, and time.  ? ? ? ?   ?Assessment & Plan:  ?Initiation of oral contraception - discussed POPs. Take at same time daily, usual side effects, change to COC when breastfeeding is complete. - Plan: norethindrone (MICRONOR) 0.35 MG tablet ? ? ?Return if symptoms worsen or fail to improve. ? ?09/15/2021, MD ?07/20/2021 ?1:07 PM ? ? ?

## 2022-11-22 ENCOUNTER — Telehealth: Payer: Self-pay | Admitting: *Deleted

## 2022-11-22 DIAGNOSIS — Z3401 Encounter for supervision of normal first pregnancy, first trimester: Secondary | ICD-10-CM | POA: Diagnosis not present

## 2022-11-22 NOTE — Telephone Encounter (Signed)
Pt called stating she has a NOB appt next week and she is wanting to go ahead and get her PN labs drawn today including a bile acids.  She had cholestasis in her last 2 pregnancies and she has already starting the itching.  PN labs ordered

## 2022-11-24 ENCOUNTER — Encounter: Payer: Self-pay | Admitting: Obstetrics & Gynecology

## 2022-11-24 DIAGNOSIS — O09899 Supervision of other high risk pregnancies, unspecified trimester: Secondary | ICD-10-CM | POA: Insufficient documentation

## 2022-11-24 DIAGNOSIS — Z3480 Encounter for supervision of other normal pregnancy, unspecified trimester: Secondary | ICD-10-CM

## 2022-11-24 DIAGNOSIS — Z3490 Encounter for supervision of normal pregnancy, unspecified, unspecified trimester: Secondary | ICD-10-CM | POA: Insufficient documentation

## 2022-11-24 HISTORY — DX: Encounter for supervision of other normal pregnancy, unspecified trimester: Z34.80

## 2022-11-24 LAB — PREGNANCY, INITIAL SCREEN
Antibody Screen: NEGATIVE
Basophils Absolute: 0 10*3/uL (ref 0.0–0.2)
Basos: 0 %
Bilirubin, UA: NEGATIVE
Chlamydia trachomatis, NAA: NEGATIVE
EOS (ABSOLUTE): 0.1 10*3/uL (ref 0.0–0.4)
Eos: 1 %
Glucose, UA: NEGATIVE
HCV Ab: NONREACTIVE
HIV Screen 4th Generation wRfx: NONREACTIVE
Hematocrit: 37.6 % (ref 34.0–46.6)
Hemoglobin: 12.8 g/dL (ref 11.1–15.9)
Hepatitis B Surface Ag: NEGATIVE
Immature Grans (Abs): 0 10*3/uL (ref 0.0–0.1)
Immature Granulocytes: 0 %
Ketones, UA: NEGATIVE
Lymphocytes Absolute: 1.3 10*3/uL (ref 0.7–3.1)
Lymphs: 19 %
MCH: 29.9 pg (ref 26.6–33.0)
MCHC: 34 g/dL (ref 31.5–35.7)
MCV: 88 fL (ref 79–97)
Monocytes Absolute: 0.5 10*3/uL (ref 0.1–0.9)
Monocytes: 7 %
Neisseria Gonorrhoeae by PCR: NEGATIVE
Neutrophils Absolute: 5.2 10*3/uL (ref 1.4–7.0)
Neutrophils: 73 %
Nitrite, UA: NEGATIVE
Platelets: 199 10*3/uL (ref 150–450)
Protein,UA: NEGATIVE
RBC, UA: NEGATIVE
RBC: 4.28 x10E6/uL (ref 3.77–5.28)
RDW: 12.5 % (ref 11.7–15.4)
RPR Ser Ql: NONREACTIVE
Rh Factor: POSITIVE
Rubella Antibodies, IGG: <0.9 index — ABNORMAL LOW (ref >0.99)
Specific Gravity, UA: 1.025 (ref 1.005–1.030)
Urobilinogen, Ur: 0.2 mg/dL (ref 0.2–1.0)
WBC: 7.2 10*3/uL (ref 3.4–10.8)
pH, UA: 5.5 (ref 5.0–7.5)

## 2022-11-24 LAB — MICROSCOPIC EXAMINATION
Bacteria, UA: NONE SEEN
Casts: NONE SEEN /lpf

## 2022-11-24 LAB — BILE ACIDS, TOTAL: Bile Acids Total: 4.3 umol/L (ref 0.0–10.0)

## 2022-11-24 LAB — HCV INTERPRETATION

## 2022-11-24 LAB — URINE CULTURE, OB REFLEX

## 2022-11-29 ENCOUNTER — Ambulatory Visit (INDEPENDENT_AMBULATORY_CARE_PROVIDER_SITE_OTHER): Payer: 59

## 2022-11-29 ENCOUNTER — Encounter: Payer: Self-pay | Admitting: Obstetrics & Gynecology

## 2022-11-29 ENCOUNTER — Ambulatory Visit (INDEPENDENT_AMBULATORY_CARE_PROVIDER_SITE_OTHER): Payer: 59 | Admitting: Obstetrics & Gynecology

## 2022-11-29 VITALS — BP 133/78 | HR 60 | Wt 143.0 lb

## 2022-11-29 DIAGNOSIS — Z1339 Encounter for screening examination for other mental health and behavioral disorders: Secondary | ICD-10-CM

## 2022-11-29 DIAGNOSIS — Z3481 Encounter for supervision of other normal pregnancy, first trimester: Secondary | ICD-10-CM

## 2022-11-29 DIAGNOSIS — O09291 Supervision of pregnancy with other poor reproductive or obstetric history, first trimester: Secondary | ICD-10-CM | POA: Diagnosis not present

## 2022-11-29 DIAGNOSIS — Z3A11 11 weeks gestation of pregnancy: Secondary | ICD-10-CM

## 2022-11-29 DIAGNOSIS — Z3A12 12 weeks gestation of pregnancy: Secondary | ICD-10-CM | POA: Diagnosis not present

## 2022-11-29 DIAGNOSIS — O09299 Supervision of pregnancy with other poor reproductive or obstetric history, unspecified trimester: Secondary | ICD-10-CM

## 2022-11-29 DIAGNOSIS — Z8632 Personal history of gestational diabetes: Secondary | ICD-10-CM

## 2022-11-29 DIAGNOSIS — Z3480 Encounter for supervision of other normal pregnancy, unspecified trimester: Secondary | ICD-10-CM

## 2022-11-29 LAB — GLUCOSE, POCT (MANUAL RESULT ENTRY): POC Glucose: 83 mg/dl (ref 70–99)

## 2022-11-30 LAB — HEMOGLOBIN A1C
Est. average glucose Bld gHb Est-mCnc: 105 mg/dL
Hgb A1c MFr Bld: 5.3 % (ref 4.8–5.6)

## 2022-12-06 NOTE — Progress Notes (Signed)
  Subjective:    Katherine Munoz is a [id number] [redacted]w[redacted]d being seen today for her first obstetrical visit.  Her obstetrical history is significant for  history of gestational diabetes and cholestasis during pregnancy . Patient does intend to breast feed. Pregnancy history fully reviewed.  Patient reports no complaints.  Vitals:   11/29/22 1020  BP: 133/78  Pulse: 60  Weight: 143 lb (64.9 kg)    HISTORY: OB History  Gravida Para Term Preterm AB Living  4 2 2  0 1 3  SAB IAB Ectopic Multiple Live Births  1     1 2     # Outcome Date GA Lbr Len/2nd Weight Sex Type Anes PTL Lv  4 Current           3 Term 02/08/21 [redacted]w[redacted]d 01:41 / 00:17 7 lb 5.3 oz (3.325 kg) F Vag-Spont EPI  LIV  2 SAB 05/2020 [redacted]w[redacted]d         1A Term 02/18/16 [redacted]w[redacted]d 07:10 / 01:17 6 lb 2.9 oz (2.805 kg) M Vag-Spont EPI, Local  LIV  1B Term 02/18/16 [redacted]w[redacted]d 07:10 / 01:23 5 lb 14.2 oz (2.67 kg) M Vag-Spont Local, EPI  LIV   Past Medical History:  Diagnosis Date   Cholestasis during pregnancy    Gestational diabetes    IBS (irritable bowel syndrome)    Medical history non-contributory    Scoliosis    Past Surgical History:  Procedure Laterality Date   TONSILLECTOMY     WISDOM TOOTH EXTRACTION     Family History  Problem Relation Age of Onset   Heart disease Maternal Grandfather    Breast cancer Other      Exam    Uterus:     Pelvic Exam:    Perineum: No Hemorrhoids   Vulva: normal   Vagina:  normal mucosa, normal discharge   pH: N/a   Cervix: no lesions   Adnexa: normal adnexa   Bony Pelvis: average  System: Breast:  normal appearance, no masses or tenderness   Skin: normal coloration and turgor, no rashes    Neurologic: oriented, no focal deficits   Extremities: normal strength, tone, and muscle mass   HEENT sclera clear, anicteric, oropharynx clear, no lesions, neck supple with midline trachea, thyroid without masses, and trachea midline   Mouth/Teeth mucous membranes moist, pharynx normal without lesions and  dental hygiene good   Neck supple   Cardiovascular: regular rate and rhythm   Respiratory:  appears well, vitals normal, no respiratory distress, acyanotic, normal RR, neck free of mass or lymphadenopathy, chest clear, no wheezing, crepitations, rhonchi, normal symmetric air entry   Abdomen: soft, non-tender; bowel sounds normal; no masses,  no organomegaly   Urinary: urethral meatus normal      Assessment:    Pregnancy: [id number] Patient Active Problem List   Diagnosis Date Noted   Rubella non-immune status, antepartum 11/24/2022   Supervision of other normal pregnancy, antepartum 11/24/2022   History of cholestasis during pregnancy 03/09/2021   History of gestational diabetes 08/01/2020   Lactose intolerance in adult 12/19/2017   Environmental and seasonal allergies 12/19/2017        Plan:     Initial labs drawn. Prenatal vitamins. Problem list reviewed and updated.  NIPS and AFP Anatomy US Random CBG 85 (approx 2-3 hrs after breakfast) and Hgb A1c Babyscripts optimized scheudling   Elsie Lincoln 12/06/2022

## 2022-12-09 DIAGNOSIS — Z3480 Encounter for supervision of other normal pregnancy, unspecified trimester: Secondary | ICD-10-CM | POA: Diagnosis not present

## 2022-12-14 ENCOUNTER — Other Ambulatory Visit: Payer: Self-pay

## 2022-12-14 DIAGNOSIS — Z3480 Encounter for supervision of other normal pregnancy, unspecified trimester: Secondary | ICD-10-CM

## 2022-12-20 ENCOUNTER — Encounter: Payer: Self-pay | Admitting: Obstetrics & Gynecology

## 2022-12-28 ENCOUNTER — Ambulatory Visit (INDEPENDENT_AMBULATORY_CARE_PROVIDER_SITE_OTHER): Payer: 59 | Admitting: Obstetrics and Gynecology

## 2022-12-28 VITALS — BP 109/71 | HR 76 | Wt 145.0 lb

## 2022-12-28 DIAGNOSIS — Z8759 Personal history of other complications of pregnancy, childbirth and the puerperium: Secondary | ICD-10-CM

## 2022-12-28 DIAGNOSIS — Z8632 Personal history of gestational diabetes: Secondary | ICD-10-CM

## 2022-12-28 DIAGNOSIS — Z3480 Encounter for supervision of other normal pregnancy, unspecified trimester: Secondary | ICD-10-CM

## 2022-12-28 DIAGNOSIS — Z8719 Personal history of other diseases of the digestive system: Secondary | ICD-10-CM

## 2022-12-28 NOTE — Progress Notes (Signed)
   PRENATAL VISIT NOTE  Subjective:  Katherine Munoz is a 27 y.o. G4P2013 at [redacted]w[redacted]d being seen today for ongoing prenatal care.  She is currently monitored for the following issues for this low-risk pregnancy and has History of gestational diabetes; Lactose intolerance in adult; Environmental and seasonal allergies; History of cholestasis during pregnancy; Rubella non-immune status, antepartum; and Supervision of other normal pregnancy, antepartum on their problem list.  GDM with both prior pregnancies.   Patient reports no complaints.  Contractions: Not present. Vag. Bleeding: None.  Movement: Absent. Denies leaking of fluid.   The following portions of the patient's history were reviewed and updated as appropriate: allergies, current medications, past family history, past medical history, past social history, past surgical history and problem list.   Objective:   Vitals:   12/28/22 1454  BP: 109/71  Pulse: 76  Weight: 145 lb (65.8 kg)    Fetal Status: Fetal Heart Rate (bpm): 150   Movement: Absent     General:  Alert, oriented and cooperative. Patient is in no acute distress.  Skin: Skin is warm and dry. No rash noted.   Cardiovascular: Normal heart rate noted  Respiratory: Normal respiratory effort, no problems with respiration noted  Abdomen: Soft, gravid, appropriate for gestational age.  Pain/Pressure: Absent     Pelvic: Cervical exam deferred        Extremities: Normal range of motion.  Edema: None  Mental Status: Normal mood and affect. Normal behavior. Normal judgment and thought content.   Assessment and Plan:  Pregnancy: G4P2013 at [redacted]w[redacted]d  1. Supervision of other normal pregnancy, antepartum  9/10- MFM scheduled  Start 81 mg of BASA daily.   2. History of gestational diabetes  With both pregnancies Last baby was LGA Normal A1c Needs early 2 hour, if normal she will need to repeat at 28 weeks.   3. History of cholestasis during pregnancy  No further itching.   Bile acids normal in early pregnancy.   Preterm labor symptoms and general obstetric precautions including but not limited to vaginal bleeding, contractions, leaking of fluid and fetal movement were reviewed in detail with the patient. Please refer to After Visit Summary for other counseling recommendations.   Return early 2 hour GTT in the next week..  Future Appointments  Date Time Provider Department Center  01/04/2023  9:00 AM [facility] NURSE [facility]  01/18/2023 12:15 PM [facility] NURSE [facility]  01/18/2023 12:30 PM [facility] US4 [facility]    Venia Carbon, NP

## 2022-12-30 DIAGNOSIS — Z3480 Encounter for supervision of other normal pregnancy, unspecified trimester: Secondary | ICD-10-CM | POA: Diagnosis not present

## 2023-01-04 ENCOUNTER — Ambulatory Visit (INDEPENDENT_AMBULATORY_CARE_PROVIDER_SITE_OTHER): Payer: 59

## 2023-01-04 DIAGNOSIS — Z8632 Personal history of gestational diabetes: Secondary | ICD-10-CM

## 2023-01-04 NOTE — Progress Notes (Signed)
Pt here for 2 hour GTT. Pt given lab orders and went to lab.

## 2023-01-05 LAB — GLUCOSE TOLERANCE, 2 HOURS W/ 1HR
Glucose, 1 hour: 108 mg/dL (ref 70–179)
Glucose, 2 hour: 117 mg/dL (ref 70–152)
Glucose, Fasting: 75 mg/dL (ref 70–91)

## 2023-01-06 LAB — PANORAMA PRENATAL TEST FULL PANEL:PANORAMA TEST PLUS 5 ADDITIONAL MICRODELETIONS: FETAL FRACTION: 7.8

## 2023-01-14 ENCOUNTER — Encounter: Payer: Self-pay | Admitting: *Deleted

## 2023-01-18 ENCOUNTER — Other Ambulatory Visit: Payer: Self-pay | Admitting: *Deleted

## 2023-01-18 ENCOUNTER — Ambulatory Visit: Payer: 59 | Attending: Obstetrics & Gynecology

## 2023-01-18 ENCOUNTER — Ambulatory Visit: Payer: 59 | Admitting: *Deleted

## 2023-01-18 ENCOUNTER — Encounter: Payer: Self-pay | Admitting: *Deleted

## 2023-01-18 VITALS — BP 109/65 | HR 67

## 2023-01-18 DIAGNOSIS — O09292 Supervision of pregnancy with other poor reproductive or obstetric history, second trimester: Secondary | ICD-10-CM | POA: Insufficient documentation

## 2023-01-18 DIAGNOSIS — Z3480 Encounter for supervision of other normal pregnancy, unspecified trimester: Secondary | ICD-10-CM | POA: Diagnosis not present

## 2023-01-18 DIAGNOSIS — Z3A19 19 weeks gestation of pregnancy: Secondary | ICD-10-CM | POA: Insufficient documentation

## 2023-01-18 DIAGNOSIS — Z363 Encounter for antenatal screening for malformations: Secondary | ICD-10-CM | POA: Diagnosis not present

## 2023-01-18 DIAGNOSIS — Z362 Encounter for other antenatal screening follow-up: Secondary | ICD-10-CM

## 2023-01-25 ENCOUNTER — Ambulatory Visit (INDEPENDENT_AMBULATORY_CARE_PROVIDER_SITE_OTHER): Payer: 59 | Admitting: Obstetrics and Gynecology

## 2023-01-25 VITALS — BP 111/71 | HR 92 | Wt 148.0 lb

## 2023-01-25 DIAGNOSIS — Z3480 Encounter for supervision of other normal pregnancy, unspecified trimester: Secondary | ICD-10-CM

## 2023-01-25 NOTE — Progress Notes (Signed)
   PRENATAL VISIT NOTE  Subjective:  Katherine Munoz is a 27 y.o. G4P2013 at [redacted]w[redacted]d being seen today for ongoing prenatal care.  She is currently monitored for the following issues for this low-risk pregnancy and has History of gestational diabetes; History of cholestasis during pregnancy; Rubella non-immune status, antepartum; and Supervision of other normal pregnancy, antepartum on their problem list.  Patient reports no complaints.  Contractions: Not present. Vag. Bleeding: None.  Movement: Present. Denies leaking of fluid.   The following portions of the patient's history were reviewed and updated as appropriate: allergies, current medications, past family history, past medical history, past social history, past surgical history and problem list.   Objective:   Vitals:   01/25/23 1400  BP: 111/71  Pulse: 92  Weight: 148 lb (67.1 kg)    Fetal Status: Fetal Heart Rate (bpm): 152 Fundal Height: 20 cm Movement: Present     General:  Alert, oriented and cooperative. Patient is in no acute distress.  Skin: Skin is warm and dry. No rash noted.   Cardiovascular: Normal heart rate noted  Respiratory: Normal respiratory effort, no problems with respiration noted  Abdomen: Soft, gravid, appropriate for gestational age.  Pain/Pressure: Absent     Pelvic: Cervical exam deferred        Extremities: Normal range of motion.  Edema: None  Mental Status: Normal mood and affect. Normal behavior. Normal judgment and thought content.   Assessment and Plan:  Pregnancy: G4P2013 at [redacted]w[redacted]d 1. Supervision of other normal pregnancy, antepartum  Reports good fetal movement.  Normal 2 hour GTT, will need traditional 28 week GTT AFP today Declined Flu shot.    Preterm labor symptoms and general obstetric precautions including but not limited to vaginal bleeding, contractions, leaking of fluid and fetal movement were reviewed in detail with the patient. Please refer to After Visit Summary for other  counseling recommendations.   No follow-ups on file.  Future Appointments  Date Time Provider Department Center  02/22/2023  3:30 PM [facility] US5 [facility]  02/24/2023 11:10 AM Milas Hock, MD [facility]    Venia Carbon, NP

## 2023-01-25 NOTE — Progress Notes (Signed)
Pt declined flu shot

## 2023-01-27 LAB — AFP, SERUM, OPEN SPINA BIFIDA
AFP MoM: 0.63
AFP Value: 35.5 ng/mL
Gest. Age on Collection Date: 20 weeks
Maternal Age At EDD: 29.1 yr
OSBR Risk 1 IN: 10000
Test Results:: NEGATIVE
Weight: 148 [lb_av]

## 2023-02-12 ENCOUNTER — Encounter: Payer: Self-pay | Admitting: Obstetrics & Gynecology

## 2023-02-13 DIAGNOSIS — Z3483 Encounter for supervision of other normal pregnancy, third trimester: Secondary | ICD-10-CM | POA: Diagnosis not present

## 2023-02-13 DIAGNOSIS — Z3482 Encounter for supervision of other normal pregnancy, second trimester: Secondary | ICD-10-CM | POA: Diagnosis not present

## 2023-02-16 DIAGNOSIS — Z3482 Encounter for supervision of other normal pregnancy, second trimester: Secondary | ICD-10-CM | POA: Diagnosis not present

## 2023-02-16 DIAGNOSIS — Z3483 Encounter for supervision of other normal pregnancy, third trimester: Secondary | ICD-10-CM | POA: Diagnosis not present

## 2023-02-22 ENCOUNTER — Ambulatory Visit: Payer: 59 | Attending: Obstetrics

## 2023-02-22 ENCOUNTER — Other Ambulatory Visit: Payer: Self-pay

## 2023-02-22 DIAGNOSIS — Z362 Encounter for other antenatal screening follow-up: Secondary | ICD-10-CM | POA: Diagnosis not present

## 2023-02-22 DIAGNOSIS — Z3A24 24 weeks gestation of pregnancy: Secondary | ICD-10-CM | POA: Diagnosis not present

## 2023-02-22 DIAGNOSIS — Z8632 Personal history of gestational diabetes: Secondary | ICD-10-CM | POA: Diagnosis not present

## 2023-02-22 DIAGNOSIS — O09292 Supervision of pregnancy with other poor reproductive or obstetric history, second trimester: Secondary | ICD-10-CM | POA: Diagnosis not present

## 2023-02-23 NOTE — Progress Notes (Unsigned)
   PRENATAL VISIT NOTE  Subjective:  Katherine Munoz is a 27 y.o. G4P2013 at [redacted]w[redacted]d being seen today for ongoing prenatal care.  She is currently monitored for the following issues for this low-risk pregnancy and has History of gestational diabetes; History of cholestasis during pregnancy; Rubella non-immune status, antepartum; and Supervision of other normal pregnancy, antepartum on their problem list.  Patient reports {sx:14538}.   .  .   . Denies leaking of fluid.   The following portions of the patient's history were reviewed and updated as appropriate: allergies, current medications, past family history, past medical history, past social history, past surgical history and problem list.   Objective:  There were no vitals filed for this visit.  Fetal Status:           General:  Alert, oriented and cooperative. Patient is in no acute distress.  Skin: Skin is warm and dry. No rash noted.   Cardiovascular: Normal heart rate noted  Respiratory: Normal respiratory effort, no problems with respiration noted  Abdomen: Soft, gravid, appropriate for gestational age.        Pelvic: Cervical exam deferred        Extremities: Normal range of motion.     Mental Status: Normal mood and affect. Normal behavior. Normal judgment and thought content.   Assessment and Plan:  Pregnancy: G4P2013 at [redacted]w[redacted]d 1. Supervision of other normal pregnancy, antepartum 28w labs next visit. Discussed contraception - pt ***  2. Rubella non-immune status, antepartum MMR after delivery  3. History of gestational diabetes 28 wk labs next time  4. History of cholestasis during pregnancy   5. Pregnancy with 24 completed weeks gestation   {Blank single:19197::"Term","Preterm"} labor symptoms and general obstetric precautions including but not limited to vaginal bleeding, contractions, leaking of fluid and fetal movement were reviewed in detail with the patient. Please refer to After Visit Summary for other  counseling recommendations.   No follow-ups on file.  Future Appointments  Date Time Provider Department Center  02/24/2023 11:10 AM Milas Hock, MD [facility]  03/24/2023 10:30 AM [facility] US1 [facility]    Milas Hock, MD

## 2023-02-24 ENCOUNTER — Other Ambulatory Visit: Payer: Self-pay | Admitting: *Deleted

## 2023-02-24 ENCOUNTER — Ambulatory Visit (INDEPENDENT_AMBULATORY_CARE_PROVIDER_SITE_OTHER): Payer: 59 | Admitting: Obstetrics and Gynecology

## 2023-02-24 VITALS — BP 105/72 | HR 87 | Wt 155.0 lb

## 2023-02-24 DIAGNOSIS — Z8632 Personal history of gestational diabetes: Secondary | ICD-10-CM

## 2023-02-24 DIAGNOSIS — Z8719 Personal history of other diseases of the digestive system: Secondary | ICD-10-CM

## 2023-02-24 DIAGNOSIS — O09899 Supervision of other high risk pregnancies, unspecified trimester: Secondary | ICD-10-CM

## 2023-02-24 DIAGNOSIS — O09299 Supervision of pregnancy with other poor reproductive or obstetric history, unspecified trimester: Secondary | ICD-10-CM

## 2023-02-24 DIAGNOSIS — Z3480 Encounter for supervision of other normal pregnancy, unspecified trimester: Secondary | ICD-10-CM

## 2023-02-24 DIAGNOSIS — Z3A24 24 weeks gestation of pregnancy: Secondary | ICD-10-CM

## 2023-02-24 DIAGNOSIS — Z8759 Personal history of other complications of pregnancy, childbirth and the puerperium: Secondary | ICD-10-CM

## 2023-02-24 DIAGNOSIS — Z2839 Other underimmunization status: Secondary | ICD-10-CM

## 2023-03-20 NOTE — Progress Notes (Unsigned)
   PRENATAL VISIT NOTE  Subjective:  Katherine Munoz is a 27 y.o. G4P2013 at [redacted]w[redacted]d being seen today for ongoing prenatal care.  She is currently monitored for the following issues for this low-risk pregnancy and has History of gestational diabetes; History of cholestasis during pregnancy; Rubella non-immune status, antepartum; and Supervision of other normal pregnancy, antepartum on their problem list.  Patient reports no complaints except some pelvic pressure.  Contractions: Not present. Vag. Bleeding: None.  Movement: Present. Denies leaking of fluid.   The following portions of the patient's history were reviewed and updated as appropriate: allergies, current medications, past family history, past medical history, past social history, past surgical history and problem list.   Objective:   Vitals:   03/23/23 0853  BP: 99/67  Pulse: 80  Weight: 157 lb (71.2 kg)    Fetal Status: Fetal Heart Rate (bpm): 143 Fundal Height: 29 cm Movement: Present    General:  Alert, oriented and cooperative. Patient is in no acute distress.  Skin: Skin is warm and dry. No rash noted.   Cardiovascular: Normal heart rate noted  Respiratory: Normal respiratory effort, no problems with respiration noted  Abdomen: Soft, gravid, appropriate for gestational age.  Pain/Pressure: Absent     Pelvic: Cervical exam deferred        Extremities: Normal range of motion.  Edema: None  Mental Status: Normal mood and affect. Normal behavior. Normal judgment and thought content.   Assessment and Plan:  Pregnancy: G4P2013 at [redacted]w[redacted]d 1. Supervision of other normal pregnancy, antepartum 28 week labs Offer tdap - pt declines  2. Rubella non-immune status, antepartum Offer MMR after delivery.   3. History of gestational diabetes 28 week labs  4. History of cholestasis during pregnancy Monitor for recurrence by symptoms.   5. Pregnancy with 28 completed weeks gestation   Preterm labor symptoms and general obstetric  precautions including but not limited to vaginal bleeding, contractions, leaking of fluid and fetal movement were reviewed in detail with the patient. Please refer to After Visit Summary for other counseling recommendations.   No follow-ups on file.  Future Appointments  Date Time Provider Department Center  03/24/2023 10:30 AM [facility] US1 [facility]    Milas Hock, MD

## 2023-03-23 ENCOUNTER — Ambulatory Visit (INDEPENDENT_AMBULATORY_CARE_PROVIDER_SITE_OTHER): Payer: 59 | Admitting: Obstetrics and Gynecology

## 2023-03-23 VITALS — BP 99/67 | HR 80 | Wt 157.0 lb

## 2023-03-23 DIAGNOSIS — Z2839 Other underimmunization status: Secondary | ICD-10-CM

## 2023-03-23 DIAGNOSIS — O09899 Supervision of other high risk pregnancies, unspecified trimester: Secondary | ICD-10-CM

## 2023-03-23 DIAGNOSIS — Z8719 Personal history of other diseases of the digestive system: Secondary | ICD-10-CM

## 2023-03-23 DIAGNOSIS — Z3480 Encounter for supervision of other normal pregnancy, unspecified trimester: Secondary | ICD-10-CM | POA: Diagnosis not present

## 2023-03-23 DIAGNOSIS — Z8632 Personal history of gestational diabetes: Secondary | ICD-10-CM

## 2023-03-23 DIAGNOSIS — Z3A28 28 weeks gestation of pregnancy: Secondary | ICD-10-CM

## 2023-03-23 DIAGNOSIS — Z8759 Personal history of other complications of pregnancy, childbirth and the puerperium: Secondary | ICD-10-CM

## 2023-03-23 NOTE — Progress Notes (Signed)
   PRENATAL VISIT NOTE  Subjective:  Katherine Munoz is a 27 y.o. G4P2013 at [redacted]w[redacted]d being seen today for ongoing prenatal care.  She is currently monitored for the following issues for this low-risk pregnancy and has History of gestational diabetes; History of cholestasis during pregnancy; Rubella non-immune status, antepartum; and Supervision of other normal pregnancy, antepartum on their problem list.  Patient reports  pelvic pressure .  Contractions: Not present. Vag. Bleeding: None.  Movement: Present. Denies leaking of fluid.   The following portions of the patient's history were reviewed and updated as appropriate: allergies, current medications, past family history, past medical history, past social history, past surgical history and problem list.   Objective:   Vitals:   03/23/23 0853  BP: 99/67  Pulse: 80  Weight: 157 lb (71.2 kg)    Fetal Status: Fetal Heart Rate (bpm): 143 Fundal Height: 29 cm Movement: Present     General:  Alert, oriented and cooperative. Patient is in no acute distress.  Skin: Skin is warm and dry. No rash noted.   Cardiovascular: Normal heart rate noted  Respiratory: Normal respiratory effort, no problems with respiration noted  Abdomen: Soft, gravid, appropriate for gestational age.  Pain/Pressure: Absent     Pelvic: Cervical exam deferred        Extremities: Normal range of motion.  Edema: None  Mental Status: Normal mood and affect. Normal behavior. Normal judgment and thought content.   Assessment and Plan:  Pregnancy: G4P2013 at [redacted]w[redacted]d 1. Supervision of other normal pregnancy, antepartum - Glucose Tolerance, 2 Hours w/1 Hour - HIV antibody (with reflex) - CBC - RPR - declined TDAP  2. Rubella non-immune status, antepartum Offer MMR postpartum  3. History of gestational diabetes GTT at today's visit.  4. History of cholestasis during pregnancy Patient is aware of symptoms of cholestasis. Reports that her symptoms started early in last  pregnancy but has not had symptoms yet..  5. Pregnancy with 28 completed weeks gestation 28 week labs today.  Preterm labor symptoms and general obstetric precautions including but not limited to vaginal bleeding, contractions, leaking of fluid and fetal movement were reviewed in detail with the patient. Please refer to After Visit Summary for other counseling recommendations.   Return in about 4 weeks (around 04/20/2023) for babyscripts.  Future Appointments  Date Time Provider Department Center  03/24/2023 10:30 AM [facility] US1 [facility]  04/20/2023  9:50 AM Lennart Pall, MD [facility]    Barrett Shell, Medical Student

## 2023-03-24 ENCOUNTER — Other Ambulatory Visit: Payer: Self-pay | Admitting: *Deleted

## 2023-03-24 ENCOUNTER — Encounter: Payer: Self-pay | Admitting: Obstetrics and Gynecology

## 2023-03-24 ENCOUNTER — Ambulatory Visit: Payer: 59 | Attending: Maternal & Fetal Medicine

## 2023-03-24 DIAGNOSIS — O09299 Supervision of pregnancy with other poor reproductive or obstetric history, unspecified trimester: Secondary | ICD-10-CM | POA: Diagnosis not present

## 2023-03-24 DIAGNOSIS — Z8719 Personal history of other diseases of the digestive system: Secondary | ICD-10-CM

## 2023-03-24 DIAGNOSIS — O09293 Supervision of pregnancy with other poor reproductive or obstetric history, third trimester: Secondary | ICD-10-CM

## 2023-03-24 DIAGNOSIS — Z8632 Personal history of gestational diabetes: Secondary | ICD-10-CM | POA: Diagnosis not present

## 2023-03-24 DIAGNOSIS — O24419 Gestational diabetes mellitus in pregnancy, unspecified control: Secondary | ICD-10-CM

## 2023-03-24 DIAGNOSIS — O24410 Gestational diabetes mellitus in pregnancy, diet controlled: Secondary | ICD-10-CM | POA: Diagnosis not present

## 2023-03-24 DIAGNOSIS — Z8759 Personal history of other complications of pregnancy, childbirth and the puerperium: Secondary | ICD-10-CM | POA: Diagnosis not present

## 2023-03-24 DIAGNOSIS — Z3A28 28 weeks gestation of pregnancy: Secondary | ICD-10-CM

## 2023-03-24 LAB — CBC
Hematocrit: 35.6 % (ref 34.0–46.6)
Hemoglobin: 11.8 g/dL (ref 11.1–15.9)
MCH: 30.3 pg (ref 26.6–33.0)
MCHC: 33.1 g/dL (ref 31.5–35.7)
MCV: 91 fL (ref 79–97)
Platelets: 166 10*3/uL (ref 150–450)
RBC: 3.9 x10E6/uL (ref 3.77–5.28)
RDW: 11.9 % (ref 11.7–15.4)
WBC: 9.1 10*3/uL (ref 3.4–10.8)

## 2023-03-24 LAB — HIV ANTIBODY (ROUTINE TESTING W REFLEX): HIV Screen 4th Generation wRfx: NONREACTIVE

## 2023-03-24 LAB — GLUCOSE TOLERANCE, 2 HOURS W/ 1HR
Glucose, 1 hour: 189 mg/dL — ABNORMAL HIGH (ref 70–179)
Glucose, 2 hour: 161 mg/dL — ABNORMAL HIGH (ref 70–152)
Glucose, Fasting: 74 mg/dL (ref 70–91)

## 2023-03-24 LAB — RPR: RPR Ser Ql: NONREACTIVE

## 2023-03-24 MED ORDER — LANCET DEVICE MISC: 1.0000 | Freq: Three times a day (TID) | 0 refills | Status: AC

## 2023-03-24 MED ORDER — BLOOD GLUCOSE MONITORING SUPPL DEVI: 1.0000 | Freq: Three times a day (TID) | 0 refills | Status: DC

## 2023-03-26 ENCOUNTER — Encounter: Payer: Self-pay | Admitting: Obstetrics and Gynecology

## 2023-03-28 ENCOUNTER — Other Ambulatory Visit: Payer: Self-pay | Admitting: *Deleted

## 2023-03-28 MED ORDER — ACCU-CHEK GUIDE TEST VI STRP: ORAL_STRIP | 12 refills | Status: DC

## 2023-03-28 MED ORDER — ACCU-CHEK SOFTCLIX LANCETS MISC: 12 refills | Status: DC

## 2023-04-14 ENCOUNTER — Encounter: Payer: Self-pay | Admitting: Obstetrics and Gynecology

## 2023-04-20 ENCOUNTER — Ambulatory Visit (INDEPENDENT_AMBULATORY_CARE_PROVIDER_SITE_OTHER): Payer: 59 | Admitting: Obstetrics and Gynecology

## 2023-04-20 VITALS — BP 107/73 | HR 78 | Wt 162.0 lb

## 2023-04-20 DIAGNOSIS — Z2839 Other underimmunization status: Secondary | ICD-10-CM

## 2023-04-20 DIAGNOSIS — O09899 Supervision of other high risk pregnancies, unspecified trimester: Secondary | ICD-10-CM

## 2023-04-20 DIAGNOSIS — O09293 Supervision of pregnancy with other poor reproductive or obstetric history, third trimester: Secondary | ICD-10-CM

## 2023-04-20 DIAGNOSIS — Z3A32 32 weeks gestation of pregnancy: Secondary | ICD-10-CM

## 2023-04-20 DIAGNOSIS — Z3480 Encounter for supervision of other normal pregnancy, unspecified trimester: Secondary | ICD-10-CM

## 2023-04-20 DIAGNOSIS — O24419 Gestational diabetes mellitus in pregnancy, unspecified control: Secondary | ICD-10-CM

## 2023-04-20 DIAGNOSIS — Z8719 Personal history of other diseases of the digestive system: Secondary | ICD-10-CM

## 2023-04-20 NOTE — Progress Notes (Signed)
   PRENATAL VISIT NOTE  Subjective:  Katherine Munoz is a 27 y.o. [phone] at [redacted]w[redacted]d being seen today for ongoing prenatal care.  She is currently monitored for the following issues for this low-risk pregnancy and has GDM (gestational diabetes mellitus); History of cholestasis during pregnancy; Rubella non-immune status, antepartum; and Supervision of other normal pregnancy, antepartum on their problem list.  Patient reports  intermittent pelvic pressure w/ activity .  Contractions: Not present. Vag. Bleeding: None.  Movement: Present. Denies leaking of fluid.   The following portions of the patient's history were reviewed and updated as appropriate: allergies, current medications, past family history, past medical history, past social history, past surgical history and problem list.   Objective:   Vitals:   04/20/23 0948  BP: 107/73  Pulse: 78  Weight: 162 lb (73.5 kg)   Fetal Status: Fetal Heart Rate (bpm): 144   Movement: Present     General:  Alert, oriented and cooperative. Patient is in no acute distress.  Skin: Skin is warm and dry. No rash noted.   Cardiovascular: Normal heart rate noted  Respiratory: Normal respiratory effort, no problems with respiration noted  Abdomen: Soft, gravid, appropriate for gestational age.  Pain/Pressure: Absent      Assessment and Plan:  Pregnancy: G4P2013 at [redacted]w[redacted]d 1. Supervision of other normal pregnancy, antepartum 2. [redacted] weeks gestation of pregnancy Risked out of babyscripts optimized scheduled due to GDM  3. Gestational diabetes mellitus (GDM) in third trimester, gestational diabetes method of control unspecified - Reviewed diagnosis of GDM - Discussed the risks associated in pregnancy especially with poor control including but not limited to increased risk of preeclampsia, macrosomia, need for operative delivery I.e. vacuum, forcep, c-section, shoulder dystocia and resulting potential nerve injury.  - Discussed diet and exercise modifications.  Patient has had GDM in her two prior pregnancies and feels comfortable with current changes - We discussed the possibility of management of the pregnancy with medications I.e. Metformin or Insulin. Discussed if medication initiated, that monitoring during the pregnancy will be started at 32 wks or at the time of medication initiation. Serial [place] for growth would be started as well. Discussed that the timing of delivery would then be in the 39th week -- BG log reviewed - 80+% of fasting & postprandial values at goal. Continue diet modification, no medications indicated at this time -- Growth [place] scheduled for tomorrow  4. History of cholestasis during pregnancy Itching last week has resolved, will defer testing for now  5. Rubella non-immune status, antepartum PP MMR  Preterm labor symptoms and general obstetric precautions including but not limited to vaginal bleeding, contractions, leaking of fluid and fetal movement were reviewed in detail with the patient.  Please refer to After Visit Summary for other counseling recommendations.   Return in about 2 weeks (around 05/04/2023) for return OB at 34 weeks.  Future Appointments  Date Time Provider Department Center  04/21/2023 11:15 AM [facility] NURSE [facility]  04/21/2023 11:30 AM [facility] US3 [facility]    Lennart Pall, MD

## 2023-04-21 ENCOUNTER — Ambulatory Visit: Payer: 59 | Admitting: *Deleted

## 2023-04-21 ENCOUNTER — Other Ambulatory Visit: Payer: Self-pay | Admitting: *Deleted

## 2023-04-21 ENCOUNTER — Ambulatory Visit: Payer: 59 | Attending: Maternal & Fetal Medicine

## 2023-04-21 VITALS — BP 132/67 | HR 84

## 2023-04-21 DIAGNOSIS — O09293 Supervision of pregnancy with other poor reproductive or obstetric history, third trimester: Secondary | ICD-10-CM

## 2023-04-21 DIAGNOSIS — O24410 Gestational diabetes mellitus in pregnancy, diet controlled: Secondary | ICD-10-CM

## 2023-04-21 DIAGNOSIS — O24419 Gestational diabetes mellitus in pregnancy, unspecified control: Secondary | ICD-10-CM | POA: Insufficient documentation

## 2023-04-21 DIAGNOSIS — Z8759 Personal history of other complications of pregnancy, childbirth and the puerperium: Secondary | ICD-10-CM | POA: Insufficient documentation

## 2023-04-21 DIAGNOSIS — Z8719 Personal history of other diseases of the digestive system: Secondary | ICD-10-CM | POA: Insufficient documentation

## 2023-04-21 DIAGNOSIS — Z3A32 32 weeks gestation of pregnancy: Secondary | ICD-10-CM

## 2023-05-06 NOTE — Progress Notes (Unsigned)
   PRENATAL VISIT NOTE  Subjective:  Katherine Munoz is a 27 y.o. G4P2013 at [redacted]w[redacted]d being seen today for ongoing prenatal care.  She is currently monitored for the following issues for this high-risk pregnancy and has GDM (gestational diabetes mellitus); History of cholestasis during pregnancy; Rubella non-immune status, antepartum; and Supervision of other normal pregnancy, antepartum on their problem list.  Patient reports {sx:14538}.   .  .   . Denies leaking of fluid.   The following portions of the patient's history were reviewed and updated as appropriate: allergies, current medications, past family history, past medical history, past social history, past surgical history and problem list.   Objective:  There were no vitals filed for this visit.  Fetal Status:           General:  Alert, oriented and cooperative. Patient is in no acute distress.  Skin: Skin is warm and dry. No rash noted.   Cardiovascular: Normal heart rate noted  Respiratory: Normal respiratory effort, no problems with respiration noted  Abdomen: Soft, gravid, appropriate for gestational age.        Pelvic: Cervical exam deferred        Extremities: Normal range of motion.     Mental Status: Normal mood and affect. Normal behavior. Normal judgment and thought content.   Assessment and Plan:  Pregnancy: G4P2013 at [redacted]w[redacted]d 1. Gestational diabetes mellitus (GDM) in third trimester, gestational diabetes method of control unspecified (Primary) Log reviewed: *** No meds Next growth is on 1/16.   2. Supervision of other normal pregnancy, antepartum Cultures nv  3. Rubella non-immune status, antepartum Offer MMR after delivery  4. History of cholestasis during pregnancy No symptoms ***  5. Pregnancy with 34 completed weeks gestation   Preterm labor symptoms and general obstetric precautions including but not limited to vaginal bleeding, contractions, leaking of fluid and fetal movement were reviewed in detail with  the patient. Please refer to After Visit Summary for other counseling recommendations.   No follow-ups on file.  Future Appointments  Date Time Provider Department Center  05/09/2023  8:30 AM Milas Hock, MD [facility]  05/18/2023  9:30 AM Lennart Pall, MD [facility]  05/25/2023  9:50 AM Lennart Pall, MD [facility]  05/26/2023 11:30 AM [facility] US5 [facility]  06/01/2023  9:30 AM Lennart Pall, MD [facility]  06/08/2023  9:30 AM Lennart Pall, MD [facility]    Milas Hock, MD

## 2023-05-09 ENCOUNTER — Ambulatory Visit (INDEPENDENT_AMBULATORY_CARE_PROVIDER_SITE_OTHER): Payer: 59 | Admitting: Obstetrics and Gynecology

## 2023-05-09 VITALS — BP 118/81 | HR 118 | Wt 163.0 lb

## 2023-05-09 DIAGNOSIS — Z8719 Personal history of other diseases of the digestive system: Secondary | ICD-10-CM

## 2023-05-09 DIAGNOSIS — O09899 Supervision of other high risk pregnancies, unspecified trimester: Secondary | ICD-10-CM

## 2023-05-09 DIAGNOSIS — Z8759 Personal history of other complications of pregnancy, childbirth and the puerperium: Secondary | ICD-10-CM

## 2023-05-09 DIAGNOSIS — Z2839 Other underimmunization status: Secondary | ICD-10-CM

## 2023-05-09 DIAGNOSIS — O24419 Gestational diabetes mellitus in pregnancy, unspecified control: Secondary | ICD-10-CM

## 2023-05-09 DIAGNOSIS — Z3480 Encounter for supervision of other normal pregnancy, unspecified trimester: Secondary | ICD-10-CM

## 2023-05-09 DIAGNOSIS — Z3A34 34 weeks gestation of pregnancy: Secondary | ICD-10-CM

## 2023-05-11 ENCOUNTER — Encounter: Payer: Self-pay | Admitting: Obstetrics and Gynecology

## 2023-05-11 NOTE — L&D Delivery Note (Signed)
Labor Progress: Katherine Munoz is a 28 y.o. female [phone] with IUP at [redacted]w[redacted]d admitted for IOL for A1GDM.  She progressed with augmentation to complete and pushed ~ 20 minutes to deliver.  Cord clamping delayed by 1 minute then clamped by CNM and cut by FOB.  Placenta intact and spontaneous, bleeding minimal.  No laceration or repair.  Mom and baby stable prior to transfer to postpartum. She plans on breastfeeding.    Delivery Note At 8:15 PM a viable and healthy female was delivered via Vaginal, Spontaneous (Presentation: Right Occiput Anterior).  APGAR:7, 9 ; weight pending.   Placenta status: Spontaneous, Intact.  Cord: 3 vessels with the following complications: none. Umbilical cord with nuchal x 1, loose cord around body, and a true knot. .  Cord pH: pending  Anesthesia: Epidural Episiotomy:  none Lacerations:  none Suture Repair:  n/a Est. Blood Loss (mL): 205  Mom to postpartum.  Baby to Couplet care / Skin to Skin.  Sharen Counter 06/10/2023, 9:07 PM

## 2023-05-12 ENCOUNTER — Other Ambulatory Visit: Payer: Self-pay | Admitting: *Deleted

## 2023-05-12 MED ORDER — ACCU-CHEK GUIDE TEST VI STRP: ORAL_STRIP | 12 refills | Status: DC

## 2023-05-18 ENCOUNTER — Ambulatory Visit (INDEPENDENT_AMBULATORY_CARE_PROVIDER_SITE_OTHER): Payer: 59 | Admitting: Obstetrics and Gynecology

## 2023-05-18 ENCOUNTER — Other Ambulatory Visit (HOSPITAL_COMMUNITY)
Admission: RE | Admit: 2023-05-18 | Discharge: 2023-05-18 | Disposition: A | Discharge status: Home / Self Care | Payer: 59 | Source: Ambulatory Visit | Attending: Obstetrics and Gynecology | Admitting: Obstetrics and Gynecology

## 2023-05-18 VITALS — BP 120/83 | HR 105 | Wt 166.0 lb

## 2023-05-18 DIAGNOSIS — O09293 Supervision of pregnancy with other poor reproductive or obstetric history, third trimester: Secondary | ICD-10-CM

## 2023-05-18 DIAGNOSIS — Z3A36 36 weeks gestation of pregnancy: Secondary | ICD-10-CM

## 2023-05-18 DIAGNOSIS — Z8719 Personal history of other diseases of the digestive system: Secondary | ICD-10-CM

## 2023-05-18 DIAGNOSIS — O09893 Supervision of other high risk pregnancies, third trimester: Secondary | ICD-10-CM

## 2023-05-18 DIAGNOSIS — Z3480 Encounter for supervision of other normal pregnancy, unspecified trimester: Secondary | ICD-10-CM | POA: Diagnosis not present

## 2023-05-18 DIAGNOSIS — O24410 Gestational diabetes mellitus in pregnancy, diet controlled: Secondary | ICD-10-CM

## 2023-05-18 DIAGNOSIS — Z2839 Other underimmunization status: Secondary | ICD-10-CM

## 2023-05-18 NOTE — Progress Notes (Signed)
   PRENATAL VISIT NOTE  Subjective:  Katherine Munoz is a 28 y.o. [phone] at [redacted]w[redacted]d being seen today for ongoing prenatal care.  She is currently monitored for the following issues for this low-risk pregnancy and has GDM (gestational diabetes mellitus); History of cholestasis during pregnancy; Rubella non-immune status, antepartum; and Supervision of other normal pregnancy, antepartum on their problem list.  Patient reports no complaints.  Contractions: Irregular. Vag. Bleeding: None.  Movement: Present. Denies leaking of fluid.   The following portions of the patient's history were reviewed and updated as appropriate: allergies, current medications, past family history, past medical history, past social history, past surgical history and problem list.   Objective:   Vitals:   05/18/23 0925  BP: 120/83  Pulse: (!) 105  Weight: 166 lb (75.3 kg)    Fetal Status: Fetal Heart Rate (bpm): 138   Movement: Present     General:  Alert, oriented and cooperative. Patient is in no acute distress.  Skin: Skin is warm and dry. No rash noted.   Cardiovascular: Normal heart rate noted  Respiratory: Normal respiratory effort, no problems with respiration noted  Abdomen: Soft, gravid, appropriate for gestational age.  Pain/Pressure: Present      Assessment and Plan:  Pregnancy: G4P2013 at [redacted]w[redacted]d 1. Supervision of other normal pregnancy, antepartum (Primary) 2. [redacted] weeks gestation of pregnancy - Culture, beta strep (group b only) - Cervicovaginal ancillary only( Vayas)  3. Diet controlled gestational diabetes mellitus (GDM) in third trimester BG reviewed - 2 elevated postprandial after 2 elevated postprandial. Overall well controlled, defer medication for now Growth @32 /2 1931g (38%), AC 58%, cephalic, posterior - next scheduled 1/16 IOL ordered for 39th week - Friday 1/31 per pt preference  4. Rubella non-immune status, antepartum PP MMR  5. History of cholestasis during pregnancy No  current symptoms  Please refer to After Visit Summary for other counseling recommendations.   Return in about 1 week (around 05/25/2023) for return OB at 37 weeks.  Future Appointments  Date Time Provider Department Center  05/25/2023  9:50 AM Erik Kieth BROCKS, MD [facility]  05/26/2023 11:30 AM [facility] US5 [facility]  06/01/2023  9:30 AM Erik Kieth BROCKS, MD [facility]  06/08/2023  9:30 AM Erik Kieth BROCKS, MD [facility]    Kieth BROCKS Erik, MD

## 2023-05-19 LAB — CERVICOVAGINAL ANCILLARY ONLY
Chlamydia: NEGATIVE
Comment: NEGATIVE
Comment: NORMAL
Neisseria Gonorrhea: NEGATIVE

## 2023-05-22 LAB — CULTURE, BETA STREP (GROUP B ONLY): Strep Gp B Culture: NEGATIVE

## 2023-05-22 NOTE — Addendum Note (Signed)
 Addended by: Harvie Bridge on: 05/22/2023 02:09 PM   Modules accepted: Orders

## 2023-05-25 ENCOUNTER — Ambulatory Visit (INDEPENDENT_AMBULATORY_CARE_PROVIDER_SITE_OTHER): Payer: 59 | Admitting: Obstetrics and Gynecology

## 2023-05-25 VITALS — BP 108/74 | HR 99 | Wt 168.0 lb

## 2023-05-25 DIAGNOSIS — Z3A37 37 weeks gestation of pregnancy: Secondary | ICD-10-CM

## 2023-05-25 DIAGNOSIS — Z8719 Personal history of other diseases of the digestive system: Secondary | ICD-10-CM

## 2023-05-25 DIAGNOSIS — Z8759 Personal history of other complications of pregnancy, childbirth and the puerperium: Secondary | ICD-10-CM

## 2023-05-25 DIAGNOSIS — Z2839 Other underimmunization status: Secondary | ICD-10-CM

## 2023-05-25 DIAGNOSIS — O24410 Gestational diabetes mellitus in pregnancy, diet controlled: Secondary | ICD-10-CM

## 2023-05-25 DIAGNOSIS — O09899 Supervision of other high risk pregnancies, unspecified trimester: Secondary | ICD-10-CM

## 2023-05-25 DIAGNOSIS — Z3480 Encounter for supervision of other normal pregnancy, unspecified trimester: Secondary | ICD-10-CM

## 2023-05-25 NOTE — Progress Notes (Signed)
   PRENATAL VISIT NOTE  Subjective:  Katherine Munoz is a 28 y.o. [phone] at [redacted]w[redacted]d being seen today for ongoing prenatal care.  She is currently monitored for the following issues for this low-risk pregnancy and has GDM (gestational diabetes mellitus); History of cholestasis during pregnancy; Rubella non-immune status, antepartum; and Supervision of other normal pregnancy, antepartum on their problem list.  Patient reports  no concerns .  Contractions: Irregular. Vag. Bleeding: None.  Movement: Present. Denies leaking of fluid.   The following portions of the patient's history were reviewed and updated as appropriate: allergies, current medications, past family history, past medical history, past social history, past surgical history and problem list.   Objective:   Vitals:   05/25/23 0955  BP: 108/74  Pulse: 99  Weight: 168 lb (76.2 kg)    Fetal Status: Fetal Heart Rate (bpm): 155   Movement: Present     General:  Alert, oriented and cooperative. Patient is in no acute distress.  Skin: Skin is warm and dry. No rash noted.   Cardiovascular: Normal heart rate noted  Respiratory: Normal respiratory effort, no problems with respiration noted  Abdomen: Soft, gravid, appropriate for gestational age.  Pain/Pressure: Present      Assessment and Plan:  Pregnancy: G4P2013 at [redacted]w[redacted]d 1. Supervision of other normal pregnancy, antepartum (Primary) 2. [redacted] weeks gestation of pregnancy GBS neg  3. Diet controlled gestational diabetes mellitus (GDM) in third trimester BG reviewed 05/11/23-05/25/23: All FBS within normal range, 2/21 PPBS elevated.  Growth @32 /2 1931g (38%), AC 58%, cephalic, posterior - next scheduled 1/16 IOL ordered for 39th week - Friday 1/31 per pt preference  4. History of cholestasis during pregnancy No current symptoms  5. Rubella non-immune status, antepartum PP MMR  Term labor symptoms and general obstetric precautions including but not limited to vaginal bleeding,  contractions, leaking of fluid and fetal movement were reviewed in detail with the patient. Please refer to After Visit Summary for other counseling recommendations.   No follow-ups on file.  Future Appointments  Date Time Provider Department Center  05/26/2023 11:30 AM [facility] US5 [facility]  06/01/2023  9:30 AM Izell Marsh, MD [facility]  06/08/2023  9:30 AM Izell Marsh, MD [facility] [place]  06/10/2023  6:45 AM [facility]-LD SCHED ROOM [facility] None    Izell Marsh, MD

## 2023-05-26 ENCOUNTER — Ambulatory Visit: Payer: 59 | Attending: Maternal & Fetal Medicine

## 2023-05-26 ENCOUNTER — Other Ambulatory Visit: Payer: Self-pay

## 2023-05-26 DIAGNOSIS — O24410 Gestational diabetes mellitus in pregnancy, diet controlled: Secondary | ICD-10-CM | POA: Insufficient documentation

## 2023-05-26 DIAGNOSIS — O09293 Supervision of pregnancy with other poor reproductive or obstetric history, third trimester: Secondary | ICD-10-CM

## 2023-05-26 DIAGNOSIS — Z3A37 37 weeks gestation of pregnancy: Secondary | ICD-10-CM | POA: Diagnosis not present

## 2023-05-30 NOTE — Progress Notes (Unsigned)
   PRENATAL VISIT NOTE  Subjective:  Katherine Munoz is a 28 y.o. G4P2013 at [redacted]w[redacted]d being seen today for ongoing prenatal care.  She is currently monitored for the following issues for this low-risk pregnancy and has GDM (gestational diabetes mellitus); History of cholestasis during pregnancy; Rubella non-immune status, antepartum; and Supervision of other normal pregnancy, antepartum on their problem list.  Patient reports {sx:[id number]}.   .  .   . Denies leaking of fluid.   The following portions of the patient's history were reviewed and updated as appropriate: allergies, current medications, past family history, past medical history, past social history, past surgical history and problem list.   Objective:  There were no vitals filed for this visit.  Fetal Status:           General:  Alert, oriented and cooperative. Patient is in no acute distress.  Skin: Skin is warm and dry. No rash noted.   Cardiovascular: Normal heart rate noted  Respiratory: Normal respiratory effort, no problems with respiration noted  Abdomen: Soft, gravid, appropriate for gestational age.         Assessment and Plan:  Pregnancy: G4P2013 at [redacted]w[redacted]d 1. Supervision of other normal pregnancy, antepartum (Primary) 2. [redacted] weeks gestation of pregnancy  3. Diet controlled gestational diabetes mellitus (GDM) in third trimester BG reviewed *** @37 /2: 3081g (49%), AC 83%, cephalic, posterior, 21.35 IOL scheduled 1/31  4. Rubella non-immune status, antepartum PP MMR  5. History of cholestasis during pregnancy Asymptomatic  Please refer to After Visit Summary for other counseling recommendations.   No follow-ups on file.  Future Appointments  Date Time Provider Department Center  06/01/2023  9:30 AM Lennart Pall, MD [facility]  06/08/2023  9:30 AM Lennart Pall, MD [facility]  06/10/2023  6:45 AM [facility]-LD SCHED ROOM [facility] None    Lennart Pall, MD

## 2023-06-01 ENCOUNTER — Ambulatory Visit (INDEPENDENT_AMBULATORY_CARE_PROVIDER_SITE_OTHER): Payer: 59 | Admitting: Obstetrics and Gynecology

## 2023-06-01 ENCOUNTER — Encounter: Payer: Self-pay | Admitting: Obstetrics and Gynecology

## 2023-06-01 ENCOUNTER — Other Ambulatory Visit: Payer: Self-pay

## 2023-06-01 VITALS — BP 115/80 | HR 103 | Wt 169.0 lb

## 2023-06-01 DIAGNOSIS — Z2839 Other underimmunization status: Secondary | ICD-10-CM

## 2023-06-01 DIAGNOSIS — O24410 Gestational diabetes mellitus in pregnancy, diet controlled: Secondary | ICD-10-CM

## 2023-06-01 DIAGNOSIS — Z3A38 38 weeks gestation of pregnancy: Secondary | ICD-10-CM

## 2023-06-01 DIAGNOSIS — O09893 Supervision of other high risk pregnancies, third trimester: Secondary | ICD-10-CM

## 2023-06-01 DIAGNOSIS — O09899 Supervision of other high risk pregnancies, unspecified trimester: Secondary | ICD-10-CM

## 2023-06-01 DIAGNOSIS — Z8719 Personal history of other diseases of the digestive system: Secondary | ICD-10-CM

## 2023-06-01 DIAGNOSIS — Z3480 Encounter for supervision of other normal pregnancy, unspecified trimester: Secondary | ICD-10-CM

## 2023-06-01 MED ORDER — ACCU-CHEK GUIDE TEST VI STRP: ORAL_STRIP | 12 refills | Status: DC

## 2023-06-01 NOTE — Patient Instructions (Signed)
[email or web address] 

## 2023-06-03 ENCOUNTER — Telehealth (HOSPITAL_COMMUNITY): Payer: Self-pay | Admitting: *Deleted

## 2023-06-03 ENCOUNTER — Encounter (HOSPITAL_COMMUNITY): Payer: Self-pay | Admitting: *Deleted

## 2023-06-03 NOTE — Telephone Encounter (Signed)
Preadmission screen

## 2023-06-07 ENCOUNTER — Ambulatory Visit (INDEPENDENT_AMBULATORY_CARE_PROVIDER_SITE_OTHER): Payer: 59 | Admitting: Obstetrics and Gynecology

## 2023-06-07 ENCOUNTER — Other Ambulatory Visit: Payer: Self-pay | Admitting: *Deleted

## 2023-06-07 VITALS — BP 121/81 | HR 125 | Wt 168.0 lb

## 2023-06-07 DIAGNOSIS — O24410 Gestational diabetes mellitus in pregnancy, diet controlled: Secondary | ICD-10-CM

## 2023-06-07 DIAGNOSIS — Z3480 Encounter for supervision of other normal pregnancy, unspecified trimester: Secondary | ICD-10-CM

## 2023-06-07 MED ORDER — ACCU-CHEK GUIDE TEST VI STRP: ORAL_STRIP | 1 refills | Status: DC

## 2023-06-07 NOTE — Progress Notes (Signed)
   PRENATAL VISIT NOTE  Subjective:  Katherine Munoz is a 28 y.o. G4P2013 at [redacted]w[redacted]d being seen today for ongoing prenatal care.  She is currently monitored for the following issues for this low-risk pregnancy and has GDM (gestational diabetes mellitus); History of cholestasis during pregnancy; Rubella non-immune status, antepartum; and Supervision of other normal pregnancy, antepartum on their problem list.  Patient reports no complaints.  Contractions: Irritability. Vag. Bleeding: None.  Movement: Present. Denies leaking of fluid.   The following portions of the patient's history were reviewed and updated as appropriate: allergies, current medications, past family history, past medical history, past social history, past surgical history and problem list.   Objective:   Vitals:   06/07/23 1015  BP: 121/81  Pulse: (!) 125  Weight: 168 lb (76.2 kg)    Fetal Status: Fetal Heart Rate (bpm): 141 Fundal Height: 39 cm Movement: Present     General:  Alert, oriented and cooperative. Patient is in no acute distress.  Skin: Skin is warm and dry. No rash noted.   Cardiovascular: Normal heart rate noted  Respiratory: Normal respiratory effort, no problems with respiration noted  Abdomen: Soft, gravid, appropriate for gestational age.  Pain/Pressure: Present     Pelvic: Cervical exam performed in the presence of a chaperone Dilation: 2.5 Effacement (%): Thick Station: -3  Extremities: Normal range of motion.  Edema: None  Mental Status: Normal mood and affect. Normal behavior. Normal judgment and thought content.   Assessment and Plan:  Pregnancy: G4P2013 at [redacted]w[redacted]d  1. Diet controlled gestational diabetes mellitus (GDM) in third trimester (Primary)   Fasting BS all <95 2 hour pp sugars continue to rise with majority >120, typically elevated in relation to white rice, oatmeal and starchy foods. Avoid starchy foods over the next 3 days.  Induction scheduled 1/31  2. Supervision of other normal  pregnancy, antepartum  - Membranes stripped per patient request.    Term labor symptoms and general obstetric precautions including but not limited to vaginal bleeding, contractions, leaking of fluid and fetal movement were reviewed in detail with the patient. Please refer to After Visit Summary for other counseling recommendations.   No follow-ups on file.  Future Appointments  Date Time Provider Department Center  06/10/2023  6:45 AM [facility]-LD SCHED ROOM [facility] None  07/12/2023  9:30 AM Khamani Daniely, Harolyn Rutherford, NP [facility]    Venia Carbon, NP

## 2023-06-08 ENCOUNTER — Other Ambulatory Visit: Payer: Self-pay | Admitting: Advanced Practice Midwife

## 2023-06-08 ENCOUNTER — Encounter: Payer: 59 | Admitting: Obstetrics and Gynecology

## 2023-06-10 ENCOUNTER — Encounter (HOSPITAL_COMMUNITY): Payer: Self-pay | Admitting: Obstetrics and Gynecology

## 2023-06-10 ENCOUNTER — Other Ambulatory Visit: Payer: Self-pay

## 2023-06-10 ENCOUNTER — Inpatient Hospital Stay (HOSPITAL_COMMUNITY): Payer: 59 | Admitting: Anesthesiology

## 2023-06-10 ENCOUNTER — Inpatient Hospital Stay (HOSPITAL_COMMUNITY): Payer: 59

## 2023-06-10 ENCOUNTER — Inpatient Hospital Stay (HOSPITAL_COMMUNITY)
Admission: RE | Admit: 2023-06-10 | Discharge: 2023-06-11 | DRG: 807 | Disposition: A | Discharge status: Home / Self Care | Payer: 59 | Attending: Family Medicine | Admitting: Family Medicine

## 2023-06-10 DIAGNOSIS — Z3A39 39 weeks gestation of pregnancy: Secondary | ICD-10-CM | POA: Diagnosis not present

## 2023-06-10 DIAGNOSIS — Z3490 Encounter for supervision of normal pregnancy, unspecified, unspecified trimester: Principal | ICD-10-CM | POA: Diagnosis present

## 2023-06-10 DIAGNOSIS — Z8249 Family history of ischemic heart disease and other diseases of the circulatory system: Secondary | ICD-10-CM

## 2023-06-10 DIAGNOSIS — O24420 Gestational diabetes mellitus in childbirth, diet controlled: Secondary | ICD-10-CM | POA: Diagnosis not present

## 2023-06-10 DIAGNOSIS — Z23 Encounter for immunization: Secondary | ICD-10-CM | POA: Diagnosis not present

## 2023-06-10 DIAGNOSIS — O24410 Gestational diabetes mellitus in pregnancy, diet controlled: Secondary | ICD-10-CM

## 2023-06-10 DIAGNOSIS — Z3480 Encounter for supervision of other normal pregnancy, unspecified trimester: Secondary | ICD-10-CM

## 2023-06-10 DIAGNOSIS — Z0542 Observation and evaluation of newborn for suspected metabolic condition ruled out: Secondary | ICD-10-CM | POA: Diagnosis not present

## 2023-06-10 LAB — CBC
HCT: 33.3 % — ABNORMAL LOW (ref 36.0–46.0)
Hemoglobin: 11.1 g/dL — ABNORMAL LOW (ref 12.0–15.0)
MCH: 28.5 pg (ref 26.0–34.0)
MCHC: 33.3 g/dL (ref 30.0–36.0)
MCV: 85.6 fL (ref 80.0–100.0)
Platelets: 258 10*3/uL (ref 150–400)
RBC: 3.89 MIL/uL (ref 3.87–5.11)
RDW: 13 % (ref 11.5–15.5)
WBC: 11.8 10*3/uL — ABNORMAL HIGH (ref 4.0–10.5)
nRBC: 0 % (ref 0.0–0.2)

## 2023-06-10 LAB — TYPE AND SCREEN
ABO/RH(D): O POS
Antibody Screen: NEGATIVE

## 2023-06-10 LAB — GLUCOSE, CAPILLARY
Glucose-Capillary: 129 mg/dL — ABNORMAL HIGH (ref 70–99)
Glucose-Capillary: 106 mg/dL — ABNORMAL HIGH (ref 70–99)
Glucose-Capillary: 70 mg/dL (ref 70–99)
Glucose-Capillary: 116 mg/dL — ABNORMAL HIGH (ref 70–99)
Glucose-Capillary: 62 mg/dL — ABNORMAL LOW (ref 70–99)

## 2023-06-10 LAB — RPR: RPR Ser Ql: NONREACTIVE

## 2023-06-10 MED ORDER — BENZOCAINE-MENTHOL 20-0.5 % EX AERO
1.0000 | INHALATION_SPRAY | CUTANEOUS | Status: DC | PRN
  Administered 2023-06-11: 1 via TOPICAL
  Filled 2023-06-10: qty 56

## 2023-06-10 MED ORDER — ACETAMINOPHEN 325 MG PO TABS: 650.0000 mg | ORAL_TABLET | ORAL | Status: DC | PRN

## 2023-06-10 MED ORDER — IBUPROFEN 600 MG PO TABS
600.0000 mg | ORAL_TABLET | Freq: Four times a day (QID) | ORAL | Status: DC
  Administered 2023-06-10 – 2023-06-11 (×4): 600 mg via ORAL
  Filled 2023-06-10 (×4): qty 1

## 2023-06-10 MED ORDER — EPHEDRINE 5 MG/ML INJ
10.0000 mg | INTRAVENOUS | Status: DC | PRN
Start: 2023-06-10 — End: 2023-06-10

## 2023-06-10 MED ORDER — METHYLERGONOVINE MALEATE 0.2 MG/ML IJ SOLN
INTRAMUSCULAR | Status: AC
  Filled 2023-06-10: qty 1

## 2023-06-10 MED ORDER — EPHEDRINE 5 MG/ML INJ: 10.0000 mg | INTRAVENOUS | Status: DC | PRN

## 2023-06-10 MED ORDER — LACTATED RINGERS IV SOLN: 500.0000 mL | Freq: Once | INTRAVENOUS | Status: DC

## 2023-06-10 MED ORDER — METHYLERGONOVINE MALEATE 0.2 MG PO TABS
0.2000 mg | ORAL_TABLET | Freq: Four times a day (QID) | ORAL | Status: AC
  Administered 2023-06-11 (×3): 0.2 mg via ORAL
  Filled 2023-06-10 (×3): qty 1

## 2023-06-10 MED ORDER — TETANUS-DIPHTH-ACELL PERTUSSIS 5-2.5-18.5 LF-MCG/0.5 IM SUSY: 0.5000 mL | PREFILLED_SYRINGE | Freq: Once | INTRAMUSCULAR | Status: DC

## 2023-06-10 MED ORDER — TRANEXAMIC ACID-NACL 1000-0.7 MG/100ML-% IV SOLN
INTRAVENOUS | Status: AC
  Filled 2023-06-10: qty 100

## 2023-06-10 MED ORDER — LACTATED RINGERS IV SOLN: INTRAVENOUS | Status: DC

## 2023-06-10 MED ORDER — ACETAMINOPHEN 325 MG PO TABS
650.0000 mg | ORAL_TABLET | ORAL | Status: DC | PRN
  Administered 2023-06-10: 650 mg via ORAL
  Filled 2023-06-10: qty 2

## 2023-06-10 MED ORDER — ONDANSETRON HCL 4 MG/2ML IJ SOLN: 4.0000 mg | INTRAMUSCULAR | Status: DC | PRN

## 2023-06-10 MED ORDER — SODIUM CHLORIDE 0.9% FLUSH: 3.0000 mL | Freq: Two times a day (BID) | INTRAVENOUS | Status: DC

## 2023-06-10 MED ORDER — SENNOSIDES-DOCUSATE SODIUM 8.6-50 MG PO TABS
2.0000 | ORAL_TABLET | Freq: Every day | ORAL | Status: DC
  Administered 2023-06-11: 2 via ORAL
  Filled 2023-06-10: qty 2

## 2023-06-10 MED ORDER — DIPHENHYDRAMINE HCL 50 MG/ML IJ SOLN
12.5000 mg | INTRAMUSCULAR | Status: DC | PRN
  Administered 2023-06-10: 12.5 mg via INTRAVENOUS
  Filled 2023-06-10: qty 1

## 2023-06-10 MED ORDER — LIDOCAINE-EPINEPHRINE (PF) 2 %-1:200000 IJ SOLN
INTRAMUSCULAR | Status: DC | PRN
  Administered 2023-06-10: 5 mL via EPIDURAL

## 2023-06-10 MED ORDER — SODIUM CHLORIDE 0.9 % IV SOLN: 250.0000 mL | INTRAVENOUS | Status: DC | PRN

## 2023-06-10 MED ORDER — ZOLPIDEM TARTRATE 5 MG PO TABS: 5.0000 mg | ORAL_TABLET | Freq: Every evening | ORAL | Status: DC | PRN

## 2023-06-10 MED ORDER — DIBUCAINE (PERIANAL) 1 % EX OINT
1.0000 | TOPICAL_OINTMENT | CUTANEOUS | Status: DC | PRN
  Administered 2023-06-11: 1 via RECTAL
  Filled 2023-06-10: qty 28

## 2023-06-10 MED ORDER — DIPHENHYDRAMINE HCL 25 MG PO CAPS: 25.0000 mg | ORAL_CAPSULE | Freq: Four times a day (QID) | ORAL | Status: DC | PRN

## 2023-06-10 MED ORDER — OXYTOCIN-SODIUM CHLORIDE 30-0.9 UT/500ML-% IV SOLN
2.5000 [IU]/h | INTRAVENOUS | Status: DC
  Administered 2023-06-10: 2.5 [IU]/h via INTRAVENOUS
  Filled 2023-06-10: qty 500

## 2023-06-10 MED ORDER — MEASLES, MUMPS & RUBELLA VAC IJ SOLR: 0.5000 mL | Freq: Once | INTRAMUSCULAR | Status: DC

## 2023-06-10 MED ORDER — ONDANSETRON HCL 4 MG/2ML IJ SOLN: 4.0000 mg | Freq: Four times a day (QID) | INTRAMUSCULAR | Status: DC | PRN

## 2023-06-10 MED ORDER — MISOPROSTOL 50MCG HALF TABLET
50.0000 ug | ORAL_TABLET | Freq: Once | ORAL | Status: AC
  Administered 2023-06-10: 50 ug via ORAL
  Filled 2023-06-10: qty 1

## 2023-06-10 MED ORDER — WITCH HAZEL-GLYCERIN EX PADS
1.0000 | MEDICATED_PAD | CUTANEOUS | Status: DC | PRN
  Administered 2023-06-11: 1 via TOPICAL

## 2023-06-10 MED ORDER — SODIUM CHLORIDE 0.9% FLUSH: 3.0000 mL | INTRAVENOUS | Status: DC | PRN

## 2023-06-10 MED ORDER — SIMETHICONE 80 MG PO CHEW
80.0000 mg | CHEWABLE_TABLET | ORAL | Status: DC | PRN
  Administered 2023-06-11: 80 mg via ORAL
  Filled 2023-06-10: qty 1

## 2023-06-10 MED ORDER — TRANEXAMIC ACID-NACL 1000-0.7 MG/100ML-% IV SOLN: 1000.0000 mg | INTRAVENOUS | Status: DC

## 2023-06-10 MED ORDER — OXYTOCIN BOLUS FROM INFUSION
333.0000 mL | Freq: Once | INTRAVENOUS | Status: AC
  Administered 2023-06-10: 333 mL via INTRAVENOUS

## 2023-06-10 MED ORDER — INSULIN ASPART 100 UNIT/ML IJ SOLN: 0.0000 [IU] | INTRAMUSCULAR | Status: DC

## 2023-06-10 MED ORDER — FENTANYL CITRATE (PF) 100 MCG/2ML IJ SOLN: 50.0000 ug | INTRAMUSCULAR | Status: DC | PRN

## 2023-06-10 MED ORDER — PHENYLEPHRINE 80 MCG/ML (10ML) SYRINGE FOR IV PUSH (FOR BLOOD PRESSURE SUPPORT)
80.0000 ug | PREFILLED_SYRINGE | INTRAVENOUS | Status: DC | PRN
  Administered 2023-06-10: 80 ug via INTRAVENOUS
  Filled 2023-06-10: qty 10

## 2023-06-10 MED ORDER — METHYLERGONOVINE MALEATE 0.2 MG/ML IJ SOLN
0.2000 mg | Freq: Once | INTRAMUSCULAR | Status: AC
  Administered 2023-06-10: 0.2 mg via INTRAMUSCULAR

## 2023-06-10 MED ORDER — MISOPROSTOL 25 MCG QUARTER TABLET
25.0000 ug | ORAL_TABLET | Freq: Once | ORAL | Status: AC
  Administered 2023-06-10: 25 ug via VAGINAL
  Filled 2023-06-10: qty 1

## 2023-06-10 MED ORDER — LACTATED RINGERS IV SOLN
500.0000 mL | INTRAVENOUS | Status: DC | PRN
  Administered 2023-06-10 (×2): 500 mL via INTRAVENOUS

## 2023-06-10 MED ORDER — PRENATAL MULTIVITAMIN CH
1.0000 | ORAL_TABLET | Freq: Every day | ORAL | Status: DC
  Administered 2023-06-11: 1 via ORAL
  Filled 2023-06-10: qty 1

## 2023-06-10 MED ORDER — TERBUTALINE SULFATE 1 MG/ML IJ SOLN: 0.2500 mg | Freq: Once | INTRAMUSCULAR | Status: DC | PRN

## 2023-06-10 MED ORDER — LIDOCAINE HCL (PF) 1 % IJ SOLN: 30.0000 mL | INTRAMUSCULAR | Status: DC | PRN

## 2023-06-10 MED ORDER — ONDANSETRON HCL 4 MG PO TABS: 4.0000 mg | ORAL_TABLET | ORAL | Status: DC | PRN

## 2023-06-10 MED ORDER — COCONUT OIL OIL: 1.0000 | TOPICAL_OIL | Status: DC | PRN

## 2023-06-10 MED ORDER — SOD CITRATE-CITRIC ACID 500-334 MG/5ML PO SOLN: 30.0000 mL | ORAL | Status: DC | PRN

## 2023-06-10 MED ORDER — FENTANYL-BUPIVACAINE-NACL 0.5-0.125-0.9 MG/250ML-% EP SOLN
12.0000 mL/h | EPIDURAL | Status: DC | PRN
  Administered 2023-06-10: 12 mL/h via EPIDURAL
  Filled 2023-06-10: qty 250

## 2023-06-10 MED ORDER — PHENYLEPHRINE 80 MCG/ML (10ML) SYRINGE FOR IV PUSH (FOR BLOOD PRESSURE SUPPORT): 80.0000 ug | PREFILLED_SYRINGE | INTRAVENOUS | Status: DC | PRN

## 2023-06-10 MED ORDER — TRANEXAMIC ACID-NACL 1000-0.7 MG/100ML-% IV SOLN
1000.0000 mg | Freq: Once | INTRAVENOUS | Status: AC
  Administered 2023-06-10: 1000 mg via INTRAVENOUS

## 2023-06-10 NOTE — Anesthesia Preprocedure Evaluation (Signed)
Anesthesia Evaluation  Patient identified by MRN, date of birth, ID band Patient awake    Reviewed: Allergy & Precautions, NPO status , Patient's Chart, lab work & pertinent test results  Airway Mallampati: II  TM Distance: >3 FB Neck ROM: Full    Dental no notable dental hx.    Pulmonary neg pulmonary ROS   Pulmonary exam normal breath sounds clear to auscultation       Cardiovascular negative cardio ROS Normal cardiovascular exam Rhythm:Regular Rate:Normal     Neuro/Psych negative neurological ROS  negative psych ROS   GI/Hepatic negative GI ROS, Neg liver ROS,,,  Endo/Other  diabetes, Gestational    Renal/GU negative Renal ROS  negative genitourinary   Musculoskeletal negative musculoskeletal ROS (+)    Abdominal   Peds  Hematology negative hematology ROS (+)   Anesthesia Other Findings IOL for gDM  Reproductive/Obstetrics (+) Pregnancy                             Anesthesia Physical Anesthesia Plan  ASA: 3  Anesthesia Plan: Epidural   Post-op Pain Management:    Induction:   PONV Risk Score and Plan: Treatment may vary due to age or medical condition  Airway Management Planned: Natural Airway  Additional Equipment:   Intra-op Plan:   Post-operative Plan:   Informed Consent: I have reviewed the patients History and Physical, chart, labs and discussed the procedure including the risks, benefits and alternatives for the proposed anesthesia with the patient or authorized representative who has indicated his/her understanding and acceptance.       Plan Discussed with: Anesthesiologist  Anesthesia Plan Comments: (Patient identified. Risks, benefits, options discussed with patient including but not limited to bleeding, infection, nerve damage, paralysis, failed block, incomplete pain control, headache, blood pressure changes, nausea, vomiting, reactions to medication,  itching, and post partum back pain. Confirmed with bedside nurse the patient's most recent platelet count. Confirmed with the patient that they are not taking any anticoagulation, have any bleeding history or any family history of bleeding disorders. Patient expressed understanding and wishes to proceed. All questions were answered. )       Anesthesia Quick Evaluation  

## 2023-06-10 NOTE — Progress Notes (Signed)
Katherine Munoz is a 28 y.o. [phone] at [redacted]w[redacted]d admitted for induction of labor due to Gestational diabetes.  Subjective: Pt feeling more painful contractions, s/o in room for support.   Objective: BP 99/62   Pulse 90   Temp 97.9 F (36.6 C) (Oral)   Resp 16   Ht 5\' 1"  (1.549 m)   Wt 76.2 kg   LMP 09/07/2022   BMI 31.76 kg/m  No intake/output data recorded. No intake/output data recorded.  FHT:  FHR: 135 bpm, variability: moderate,  accelerations:  Present,  decelerations:  Absent UC:   regular, every 2-3 minutes SVE:   Dilation: 3 Effacement (%): 70 Station: -3 Exam by:: Leftwich-Kirby, CNM  Labs: Lab Results  Component Value Date   WBC 11.8 (H) 06/10/2023   HGB 11.1 (L) 06/10/2023   HCT 33.3 (L) 06/10/2023   MCV 85.6 06/10/2023   PLT 258 06/10/2023    Assessment / Plan: IOL for A1GDM  Labor:  Progressing normally. Discussed options with patient. Give another dose of Cytotec, AROM, or start Pitocin.  Pt opted for AROM at this time.  She desires epidural first, so CNM to return for AROM when pt is comfortable.  Discussed initiating Pitocin in 1-2 hours if contractions are not adequate and pt agrees wit plan of care.  Preeclampsia:   n/a Fetal Wellbeing:  Category I Pain Control:  Labor support without medications I/D:   GBS neg Anticipated MOD:  NSVD  Sharen Counter, CNM 06/10/2023, 12:43 PM

## 2023-06-10 NOTE — Anesthesia Procedure Notes (Signed)
Epidural Patient location during procedure: OB Start time: 06/10/2023 12:55 PM End time: 06/10/2023 1:05 PM  Staffing Anesthesiologist: Elmer Picker, MD Performed: anesthesiologist   Preanesthetic Checklist Completed: patient identified, IV checked, risks and benefits discussed, monitors and equipment checked, pre-op evaluation and timeout performed  Epidural Patient position: sitting Prep: DuraPrep and site prepped and draped Patient monitoring: continuous pulse ox, blood pressure, heart rate and cardiac monitor Approach: midline Location: L3-L4 Injection technique: LOR air  Needle:  Needle type: Tuohy  Needle gauge: 17 G Needle length: 9 cm Needle insertion depth: 5 cm Catheter type: closed end flexible Catheter size: 19 Gauge Catheter at skin depth: 10 cm Test dose: negative  Assessment Sensory level: T8 Events: blood not aspirated, no cerebrospinal fluid, injection not painful, no injection resistance, no paresthesia and negative IV test  Additional Notes Patient identified. Risks/Benefits/Options discussed with patient including but not limited to bleeding, infection, nerve damage, paralysis, failed block, incomplete pain control, headache, blood pressure changes, nausea, vomiting, reactions to medication both or allergic, itching and postpartum back pain. Confirmed with bedside nurse the patient's most recent platelet count. Confirmed with patient that they are not currently taking any anticoagulation, have any bleeding history or any family history of bleeding disorders. Patient expressed understanding and wished to proceed. All questions were answered. Sterile technique was used throughout the entire procedure. Please see nursing notes for vital signs. Test dose was given through epidural catheter and negative prior to continuing to dose epidural or start infusion. Warning signs of high block given to the patient including shortness of breath, tingling/numbness in  hands, complete motor block, or any concerning symptoms with instructions to call for help. Patient was given instructions on fall risk and not to get out of bed. All questions and concerns addressed with instructions to call with any issues or inadequate analgesia.  Reason for block:procedure for pain

## 2023-06-10 NOTE — Progress Notes (Signed)
Katherine Munoz is a 28 y.o. [phone] at [redacted]w[redacted]d admitted for induction of labor due to A1GDM. EFW 49% a 37w.  Subjective:   Objective: BP 99/62   Pulse 90   Temp 97.9 F (36.6 C) (Oral)   Resp 16   Ht 5\' 1"  (1.549 m)   Wt 76.2 kg   LMP 09/07/2022   BMI 31.76 kg/m  No intake/output data recorded. No intake/output data recorded.  FHT:  FHR: 125 bpm, variability: moderate,  accelerations:  Present,  decelerations:  Absent UC:   regular, every 2-4 minutes SVE:   Dilation: 2.5 Effacement (%): Thick Station: Ballotable Exam by:: Dr. Lucianne Muss Exam at 7am.  Deferred exam at this time  Labs: Lab Results  Component Value Date   WBC 11.8 (H) 06/10/2023   HGB 11.1 (L) 06/10/2023   HCT 33.3 (L) 06/10/2023   MCV 85.6 06/10/2023   PLT 258 06/10/2023    Assessment / Plan: Induction of labor due to gestational diabetes  Labor: Progressing normally Preeclampsia:   n/a Fetal Wellbeing:  Category I Pain Control:  Labor support without medications I/D:   GBS neg Anticipated MOD:  NSVD  Sharen Counter, CNM 06/10/2023, 10:19 AM

## 2023-06-10 NOTE — Progress Notes (Signed)
Katherine Munoz is a 28 y.o. G4P2013 at [redacted]w[redacted]d by {CHL MAU METHOD OF PREG DETERMINATION:21616} admitted for {Reason for Admission:21615}  Subjective:   Objective: BP 115/72   Pulse 84   Temp 97.9 F (36.6 C) (Oral)   Resp 16   Ht 5\' 1"  (1.549 m)   Wt 76.2 kg   LMP 09/07/2022   SpO2 100%   BMI 31.76 kg/m  No intake/output data recorded. No intake/output data recorded.  FHT:  {findings; monitor fetal heart monitor:21620} UC:   {obgyn contractions reg/irreg:312982} SVE:   Dilation: 4 Effacement (%): 70 Station: -1 Exam by:: Leftwich-Kirby, CNM  Labs: Lab Results  Component Value Date   WBC 11.8 (H) 06/10/2023   HGB 11.1 (L) 06/10/2023   HCT 33.3 (L) 06/10/2023   MCV 85.6 06/10/2023   PLT 258 06/10/2023    Assessment / Plan: {CHL LABOR ASSESSMENT:21627}  Labor: {CHL LABOR PROGRESS:21622} Preeclampsia:  {CHL PREECLAMPSIA PLAN:21623} Fetal Wellbeing:  {CHL FWB PLAN:21624} Pain Control:  {CHL FWB PLAN:21625} I/D:  {NA AND WGNFAOZH:08657} Anticipated MOD:  {QIO:96295}  Sharen Counter, CNM 06/10/2023, 2:31 PM

## 2023-06-10 NOTE — H&P (Signed)
OBSTETRIC ADMISSION HISTORY AND PHYSICAL  Katherine Munoz is a 28 y.o. female [phone] with IUP at [redacted]w[redacted]d (dated by LMP, Estimated Date of Delivery: 06/14/23) presenting for IOL due to A1GDM.   She reports +FMs, No LOF, no VB, no blurry vision, headaches or peripheral edema, and RUQ pain.    She plans on breast feeding. She declined birth control.  She received her prenatal care at  [facility]    Prenatal History/Complications:  - A1GDM - Rubella non-immune status  Past Medical History: Past Medical History:  Diagnosis Date   Cholestasis during pregnancy    Environmental and seasonal allergies 12/19/2017   Gestational diabetes    IBS (irritable bowel syndrome)    Lactose intolerance in adult 12/19/2017   Medical history non-contributory    Scoliosis    Supervision of other normal pregnancy, antepartum 11/24/2022              NURSING     PROVIDER      Office Location        Dating by    LMP c/w U/S at 11 wks      [facility] Model    Traditional    Anatomy U/S           Initiated care at     [employer]     English                     LAB RESULTS       Support Person         Genetics    NIPS:   AFP:                 NT/IT (FT only)                     Carrier Screen    [facility]    Past Surgical History: Past Surgical History:  Procedure Laterality Date   TONSILLECTOMY     WISDOM TOOTH EXTRACTION      Obstetrical History: OB History     Gravida  4   Para  2   Term  2   Preterm  0   AB  1   Living  3      SAB  1   IAB      Ectopic      Multiple  1   Live Births  2           Social History Social History   Socioeconomic History   Marital status: Married    Spouse name: Francisco   Number of children: Not on file   Years of education: Not on file   Highest education level: Not on file  Occupational History   Not on file  Tobacco Use   Smoking status: Never   Smokeless tobacco: Never  Vaping Use   Vaping status: Never Used   Substance and Sexual Activity   Alcohol use: Not Currently    Comment: Socially   Drug use: No   Sexual activity: Yes    Birth control/protection: None  Other Topics Concern   Not on file  Social History Narrative   Not on file   Social Drivers of Health   Financial Resource Strain: Low Risk  (06/11/2021)   Received from [facility], [facility]  Overall Financial Resource Strain (CARDIA)    Difficulty of Paying Living Expenses: Not very hard  Food Insecurity: No Food Insecurity (06/10/2023)   Hunger Vital Sign    Worried About Running Out of Food in the Last Year: Never true    Ran Out of Food in the Last Year: Never true  Transportation Needs: No Transportation Needs (06/10/2023)   PRAPARE - Administrator, Civil Service (Medical): No    Lack of Transportation (Non-Medical): No  Physical Activity: Insufficiently Active (06/11/2021)   Received from [facility], [facility]   Exercise Vital Sign    Days of Exercise per Week: 3 days    Minutes of Exercise per Session: 30 min  Stress: No Stress Concern Present (06/11/2021)   Received from [facility], [facility] - Occupational Stress Questionnaire    Feeling of Stress : Not at all  Social Connections: Unknown (09/21/2021)   Received from [facility], [facility]   Social Network    Social Network: Not on file    Family History: Family History  Problem Relation Age of Onset   Heart disease Maternal Grandfather    Breast cancer Other     Allergies: No Known Allergies  Medications Prior to Admission  Medication Sig Dispense Refill Last Dose/Taking   Accu-Chek Softclix Lancets lancets Use as instructed 100 each 12 06/09/2023   Blood Glucose Monitoring Suppl DEVI 1 each by Does not apply route in the morning, at noon, and at bedtime. May substitute to any manufacturer covered by patient's insurance. 1 each 0 06/09/2023   glucose blood (ACCU-CHEK GUIDE TEST)  test strip Use to check blood glucoses 4x daily 100 each 1 06/09/2023   Prenatal Vit-Fe Fumarate-FA (PRENATAL VITAMIN PO) Take by mouth.   06/09/2023     Review of Systems  All systems reviewed and negative except as stated in HPI.  Blood pressure 121/84, pulse 92, temperature 97.9 F (36.6 C), temperature source Oral, resp. rate 16, height 5\' 1"  (1.549 m), weight 76.2 kg, last menstrual period 09/07/2022, currently breastfeeding. General appearance: alert and cooperative Lungs: breathing comfortably on room air Heart: regular rate Abdomen: soft, non-tender; gravid Extremities: no edema of bilateral lower extremities Presentation: cephalic Fetal monitoring: 135/mod/+a/-d Uterine activity: irregular, every 10 min Dilation: 2.5 Effacement (%): Thick Station: Ballotable Exam by:: Dr. Lucianne Muss   Prenatal labs: ABO, Rh: --/--/PENDING (01/31 1610) Antibody: PENDING (01/31 0627) Rubella: <0.90 (07/15 1347) RPR: Non Reactive (11/13 0929)  HBsAg: Negative (07/15 1347)  HIV: Non Reactive (11/13 0929)  GBS: Negative/-- (01/08 0926)  2 hr Glucola abnl Genetic screening LR, female Anatomy US normal Last [place]: At [redacted]w[redacted]d - cephalic presentation, EFW 3081g (49 %tile), AC 83%tile  Prenatal Transfer Tool  Maternal Diabetes: Yes:  Diabetes Type:  Diet controlled Genetic Screening: Normal Maternal Ultrasounds/Referrals: Normal Fetal Ultrasounds or other Referrals:  None Maternal Substance Abuse:  No Significant Maternal Medications:  None Significant Maternal Lab Results:  Group B Strep negative Number of Prenatal Visits:greater than 3 verified prenatal visits Other Comments:  None  Results for orders placed or performed during the hospital encounter of 06/10/23 (from the past 24 hours)  Type and screen   Collection Time: 06/10/23  6:27 AM  Result Value Ref Range   ABO/RH(D) PENDING    Antibody Screen PENDING    Sample Expiration      06/13/2023,2359 Performed at [facility],  [address], [place], [place] [address]  Glucose, capillary   Collection Time: 06/10/23  6:28 AM  Result Value Ref Range   Glucose-Capillary 129 (H) 70 - 99 mg/dL  Glucose, capillary   Collection Time: 06/10/23  6:45 AM  Result Value Ref Range   Glucose-Capillary 106 (H) 70 - 99 mg/dL    Patient Active Problem List   Diagnosis Date Noted   Encounter for induction of labor 06/10/2023   Rubella non-immune status, antepartum 11/24/2022   Supervision of other normal pregnancy, antepartum 11/24/2022   History of cholestasis during pregnancy 03/09/2021   GDM (gestational diabetes mellitus) 08/01/2020    Assessment/Plan:  Katherine Munoz is a 28 y.o. G4P2013 at [redacted]w[redacted]d here for IOL due to A1GDM  #Labor: Pt familiar w IOL process, has had FB, Pit, AROM in past inductions  given cervix still quite thick, will start w dual Cytotec #Pain: Per pt request #FWB: Cat I #ID:  GBS neg #MOF: Breast #MOC:Declines #Circ:  N/A  #A1GDM: AGA, EFW and AC as above  proven to 3325g, extrapolated weight 3500g; has hx twin SVD  will check CBG q4h during latent labor then q2h during active labor  discussed reasons why we may use Endotool w pt in detail  #Rubella NI: Offer vaccine PP  Sundra Aland, MD OB Fellow, Faculty Practice [facility], Center for [facility] 06/10/23 7:03 AM

## 2023-06-10 NOTE — Discharge Summary (Signed)
Postpartum Discharge Summary  Date of Service updated***     Patient Name: Katherine Munoz DOB: 04-Oct-1995 MRN: [id number]  Date of admission: 06/10/2023 Delivery date:06/10/2023 Delivering provider: Sharen Counter A Date of discharge: 06/10/2023  Admitting diagnosis: Encounter for induction of labor [Z34.90] Intrauterine pregnancy: [redacted]w[redacted]d     Secondary diagnosis:  Principal Problem:   Encounter for induction of labor  Additional problems: none    Discharge diagnosis: Term Pregnancy Delivered                                              Post partum procedures:{Postpartum procedures:23558} Augmentation: AROM and Cytotec Complications: None  Hospital course: Induction of Labor With Vaginal Delivery   28 y.o. yo [phone] at [redacted]w[redacted]d was admitted to the hospital 06/10/2023 for induction of labor.  Indication for induction: A1 DM.  Patient had an uncomplicated labor course. Membrane Rupture Time/Date: 2:28 PM,06/10/2023  Delivery Method:Vaginal, Spontaneous Operative Delivery:N/A Episiotomy:   none Lacerations:    none Details of delivery can be found in separate delivery note.  Patient had a postpartum course complicated by***. Patient is discharged home 06/10/23.  Newborn Data: Birth date:06/10/2023 Birth time:8:15 PM Gender:Female Living status:  Apgars: ,  Weight:   Magnesium Sulfate received: No BMZ received: No Rhophylac:No EXB:{MWU:[id number]} T-DaP:*** Flu: No RSV Vaccine received: No Transfusion:No  Immunizations received: Immunization History  Administered Date(s) Administered   Influenza,inj,Quad PF,6+ Mos 01/19/2016, 02/15/2018   MMR 02/20/2016   Tdap 01/19/2016, 12/02/2020    Physical exam  Vitals:   06/10/23 1731 06/10/23 1801 06/10/23 1901 06/10/23 1938  BP: 115/81 107/65 (!) 101/58 (!) 96/59  Pulse: 98 95 81 94  Resp:    18  Temp:    98.7 F (37.1 C)  TempSrc:    Oral  SpO2:      Weight:      Height:       General: {Exam;  general:21111117} Lochia: {Desc; appropriate/inappropriate:30686::"appropriate"} Uterine Fundus: {Desc; firm/soft:30687} Incision: {Exam; incision:21111123} DVT Evaluation: {Exam; dvt:2111122} Labs: Lab Results  Component Value Date   WBC 11.8 (H) 06/10/2023   HGB 11.1 (L) 06/10/2023   HCT 33.3 (L) 06/10/2023   MCV 85.6 06/10/2023   PLT 258 06/10/2023      Latest Ref Rng & Units 12/30/2020    2:39 PM  CMP  Glucose 65 - 139 mg/dL 60   BUN 7 - 25 mg/dL 7   Creatinine 2.72 - 5.36 mg/dL 6.44   Sodium 034 - 742 mmol/L 135   Potassium 3.5 - 5.3 mmol/L 4.0   Chloride 98 - 110 mmol/L 104   CO2 20 - 32 mmol/L 23   Calcium 8.6 - 10.2 mg/dL 8.8   Total Protein 6.1 - 8.1 g/dL 6.4   Total Bilirubin 0.2 - 1.2 mg/dL 0.3   AST 10 - 30 U/L 12   ALT 6 - 29 U/L 7    Edinburgh Score:    03/06/2021   10:38 AM  Edinburgh Postnatal Depression Scale Screening Tool  I have been able to laugh and see the funny side of things. 0  I have looked forward with enjoyment to things. 0  I have blamed myself unnecessarily when things went wrong. 0  I have been anxious or worried for no good reason. 0  I have felt scared or panicky for no good reason. 0  Things have  been getting on top of me. 0  I have been so unhappy that I have had difficulty sleeping. 0  I have felt sad or miserable. 0  I have been so unhappy that I have been crying. 0  The thought of harming myself has occurred to me. 0  Edinburgh Postnatal Depression Scale Total 0   No data recorded  After visit meds:  Allergies as of 06/10/2023   No Known Allergies   Med Rec must be completed prior to using this [facility]***        Discharge home in stable condition Infant Feeding: Breast Infant Disposition:home with mother Discharge instruction: per After Visit Summary and Postpartum booklet. Activity: Advance as tolerated. Pelvic rest for 6 weeks.  Diet: routine diet Future Appointments: Future Appointments  Date Time Provider  Department Center  07/12/2023  9:30 AM Rasch, Harolyn Rutherford, NP [facility]   Follow up Visit:  Message sent to [facility]:  Please schedule this patient for a In person postpartum visit in 6 weeks with the following provider: Any provider. Additional Postpartum F/U:2 hour GTT  Low risk pregnancy complicated by: GDM Delivery mode:  Vaginal, Spontaneous Anticipated Birth Control:   declines   06/10/2023 Sharen Counter, CNM

## 2023-06-11 LAB — GLUCOSE, CAPILLARY: Glucose-Capillary: 85 mg/dL (ref 70–99)

## 2023-06-11 MED ORDER — IBUPROFEN 600 MG PO TABS: 600.0000 mg | ORAL_TABLET | Freq: Four times a day (QID) | ORAL | 0 refills | Status: DC

## 2023-06-11 NOTE — Lactation Note (Signed)
This note was copied from a baby's chart. Lactation Consultation Note  Patient Name: Katherine Munoz [id number]'[id number] Date: 06/11/2023 Age:30 hours Reason for consult: Follow-up assessment;Mother's request;Term;Maternal endocrine disorder  P4- MOB reports that infant is nursing very well and denies having any tissue breakdown. MOB reports that infant recently started cluster feeding and wanted to know if that is normal. LC reviewed cluster feeding and reassured her that this is normal. MOB denies having further questions or concerns. LC reviewed CDC milk storage guidelines, LC services handout and engorgement/breast care. LC encouraged MOB to call for further assistance as needed.  Maternal Data Has patient been taught Hand Expression?: Yes Does the patient have breastfeeding experience prior to this delivery?: Yes  Feeding Mother's Current Feeding Choice: Breast Milk  Lactation Tools Discussed/Used Pump Education: Milk Storage  Interventions Interventions: Breast feeding basics reviewed;Education;LC Services brochure  Discharge Discharge Education: Engorgement and breast care;Warning signs for feeding baby Pump: Hands Free;Personal  Consult Status Consult Status: Complete Date: 06/11/23    Dema Severin BS, IBCLC 06/11/2023, 5:49 PM

## 2023-06-11 NOTE — Anesthesia Postprocedure Evaluation (Signed)
Anesthesia Post Note  Patient: Katherine Munoz  Procedure(s) Performed: AN AD HOC LABOR EPIDURAL     Patient location during evaluation: Mother Baby Anesthesia Type: Epidural Level of consciousness: awake and alert Pain management: pain level controlled Vital Signs Assessment: post-procedure vital signs reviewed and stable Respiratory status: spontaneous breathing, nonlabored ventilation and respiratory function stable Cardiovascular status: stable Postop Assessment: no headache, no backache, epidural receding and able to ambulate Anesthetic complications: no   No notable events documented.  Last Vitals:  Vitals:   06/11/23 0335 06/11/23 0745  BP: 107/65 110/69  Pulse: 82 79  Resp: 18 18  Temp: 36.7 C 36.8 C  SpO2: 99% 99%    Last Pain:  Vitals:   06/11/23 0924  TempSrc:   PainSc: 4    Pain Goal:                   Anothy Bufano

## 2023-06-11 NOTE — Lactation Note (Signed)
This note was copied from a baby's chart. Lactation Consultation Note  Patient Name: Katherine Munoz [id number] Date: 06/11/2023 Age:30 hours Reason for consult: Initial assessment;Term Per MOB, infant is breastfeeding well no concerns for LC, infant recently BF for 20 minutes. MOB is experienced with breastfeeding see maternal data below. MOB knows to call if she needs latch assistance. MOB will continue to BF infant every 2-3 hours, skin to skin. Per MOB, infant had 2 stools since birth. LC discussed the importance of maternal rest, balance meals and snacks. MOB was  made aware of O/P services, breastfeeding support groups, community resources, and our phone # for post-discharge questions.    Maternal Data Has patient been taught Hand Expression?: Yes Does the patient have breastfeeding experience prior to this delivery?: Yes How long did the patient breastfeed?: Per MOB, she BF twins for 4 months, and rrd child for 8  Feeding Mother's Current Feeding Choice: Breast Milk  LATCH Score  LC did not observe latch due to infant recently breastfeeding, see flow sheet MOB has latch of 10.                   Lactation Tools Discussed/Used    Interventions Interventions: Breast feeding basics reviewed;Skin to skin;Position options;Education;LC Services brochure  Discharge Pump: DEBP;Personal  Consult Status Consult Status: Follow-up Date: 06/11/23 Follow-up type: In-patient    Frederico Hamman 06/11/2023, 1:47 AM

## 2023-06-11 NOTE — Plan of Care (Signed)
Problem: Education: Goal: Knowledge of General Education information will improve Description: Including pain rating scale, medication(s)/side effects and non-pharmacologic comfort measures Outcome: Adequate for Discharge   Problem: Health Behavior/Discharge Planning: Goal: Ability to manage health-related needs will improve Outcome: Adequate for Discharge   Problem: Clinical Measurements: Goal: Ability to maintain clinical measurements within normal limits will improve Outcome: Adequate for Discharge Goal: Will remain free from infection Outcome: Adequate for Discharge Goal: Diagnostic test results will improve Outcome: Adequate for Discharge Goal: Respiratory complications will improve Outcome: Adequate for Discharge Goal: Cardiovascular complication will be avoided Outcome: Adequate for Discharge   Problem: Activity: Goal: Risk for activity intolerance will decrease Outcome: Adequate for Discharge   Problem: Nutrition: Goal: Adequate nutrition will be maintained Outcome: Adequate for Discharge   Problem: Coping: Goal: Level of anxiety will decrease Outcome: Adequate for Discharge   Problem: Elimination: Goal: Will not experience complications related to bowel motility Outcome: Adequate for Discharge Goal: Will not experience complications related to urinary retention Outcome: Adequate for Discharge   Problem: Pain Managment: Goal: General experience of comfort will improve and/or be controlled Outcome: Adequate for Discharge   Problem: Safety: Goal: Ability to remain free from injury will improve Outcome: Adequate for Discharge   Problem: Skin Integrity: Goal: Risk for impaired skin integrity will decrease Outcome: Adequate for Discharge   Problem: Education: Goal: Ability to describe self-care measures that may prevent or decrease complications (Diabetes Survival Skills Education) will improve Outcome: Adequate for Discharge Goal: Individualized Educational  Video(s) Outcome: Adequate for Discharge   Problem: Coping: Goal: Ability to adjust to condition or change in health will improve Outcome: Adequate for Discharge   Problem: Fluid Volume: Goal: Ability to maintain a balanced intake and output will improve Outcome: Adequate for Discharge   Problem: Health Behavior/Discharge Planning: Goal: Ability to identify and utilize available resources and services will improve Outcome: Adequate for Discharge Goal: Ability to manage health-related needs will improve Outcome: Adequate for Discharge   Problem: Metabolic: Goal: Ability to maintain appropriate glucose levels will improve Outcome: Adequate for Discharge   Problem: Nutritional: Goal: Maintenance of adequate nutrition will improve Outcome: Adequate for Discharge Goal: Progress toward achieving an optimal weight will improve Outcome: Adequate for Discharge   Problem: Skin Integrity: Goal: Risk for impaired skin integrity will decrease Outcome: Adequate for Discharge   Problem: Tissue Perfusion: Goal: Adequacy of tissue perfusion will improve Outcome: Adequate for Discharge   Problem: Education: Goal: Knowledge of Childbirth will improve Outcome: Adequate for Discharge Goal: Ability to make informed decisions regarding treatment and plan of care will improve Outcome: Adequate for Discharge Goal: Ability to state and carry out methods to decrease the pain will improve Outcome: Adequate for Discharge Goal: Individualized Educational Video(s) Outcome: Adequate for Discharge   Problem: Coping: Goal: Ability to verbalize concerns and feelings about labor and delivery will improve Outcome: Adequate for Discharge   Problem: Life Cycle: Goal: Ability to make normal progression through stages of labor will improve Outcome: Adequate for Discharge Goal: Ability to effectively push during vaginal delivery will improve Outcome: Adequate for Discharge   Problem: Role  Relationship: Goal: Will demonstrate positive interactions with the child Outcome: Adequate for Discharge   Problem: Safety: Goal: Risk of complications during labor and delivery will decrease Outcome: Adequate for Discharge   Problem: Pain Management: Goal: Relief or control of pain from uterine contractions will improve Outcome: Adequate for Discharge   Problem: Education: Goal: Knowledge of condition will improve Outcome: Adequate for Discharge Goal:  Individualized Educational Video(s) Outcome: Adequate for Discharge Goal: Individualized Newborn Educational Video(s) Outcome: Adequate for Discharge   Problem: Activity: Goal: Will verbalize the importance of balancing activity with adequate rest periods Outcome: Adequate for Discharge Goal: Ability to tolerate increased activity will improve Outcome: Adequate for Discharge   Problem: Coping: Goal: Ability to identify and utilize available resources and services will improve Outcome: Adequate for Discharge   Problem: Life Cycle: Goal: Chance of risk for complications during the postpartum period will decrease Outcome: Adequate for Discharge   Problem: Role Relationship: Goal: Ability to demonstrate positive interaction with newborn will improve Outcome: Adequate for Discharge   Problem: Skin Integrity: Goal: Demonstration of wound healing without infection will improve Outcome: Adequate for Discharge

## 2023-06-20 ENCOUNTER — Telehealth (HOSPITAL_COMMUNITY): Payer: Self-pay | Admitting: *Deleted

## 2023-06-20 NOTE — Telephone Encounter (Signed)
 06/20/2023  Name: Katherine Munoz MRN: [id number] DOB: 1995-11-18  Reason for Call:  Transition of Care Hospital Discharge Call  Contact Status: Patient Contact Status: Message  Language assistant needed:          Follow-Up Questions:    Dimple Francis Postnatal Depression Scale:  In the Past 7 Days:    PHQ2-9 Depression Scale:     Discharge Follow-up:    Post-discharge interventions: NA  Pearlie Bougie, RN 06/20/2023 14:54

## 2023-07-12 ENCOUNTER — Ambulatory Visit: Payer: 59 | Admitting: Obstetrics and Gynecology

## 2023-07-14 ENCOUNTER — Encounter: Payer: Self-pay | Admitting: Obstetrics and Gynecology

## 2023-07-14 ENCOUNTER — Ambulatory Visit (INDEPENDENT_AMBULATORY_CARE_PROVIDER_SITE_OTHER): Payer: 59 | Admitting: Obstetrics and Gynecology

## 2023-07-14 DIAGNOSIS — R102 Pelvic and perineal pain: Secondary | ICD-10-CM | POA: Diagnosis not present

## 2023-07-14 NOTE — Progress Notes (Signed)
 Post Partum Visit Note  Katherine Munoz is a 28 y.o. [phone] female who presents for a postpartum visit. She is 5 weeks postpartum following a normal spontaneous vaginal delivery.  I have fully reviewed the prenatal and intrapartum course. The delivery was at [redacted]w[redacted]d gestational weeks.  Anesthesia: epidural. Postpartum course has been unremarkable. Baby is doing well. Baby is feeding by breast. Bleeding staining only. Bowel function is normal. Bladder function is normal. Patient is not sexually active. Contraception method is none. Postpartum depression screening: negative.    The pregnancy intention screening data noted above was reviewed. Potential methods of contraception were discussed. The patient elected to proceed with No Contraception Precautions.   Edinburgh Postnatal Depression Scale - 07/14/23 1028       Edinburgh Postnatal Depression Scale:  In the Past 7 Days   I have been able to laugh and see the funny side of things. 0    I have looked forward with enjoyment to things. 0    I have blamed myself unnecessarily when things went wrong. 0    I have been anxious or worried for no good reason. 0    I have felt scared or panicky for no good reason. 0    Things have been getting on top of me. 0    I have been so unhappy that I have had difficulty sleeping. 0    I have felt sad or miserable. 0    I have been so unhappy that I have been crying. 0    The thought of harming myself has occurred to me. 0    Edinburgh Postnatal Depression Scale Total 0             Health Maintenance Due  Topic Date Due   FOOT EXAM  Never done   OPHTHALMOLOGY EXAM  Never done   Diabetic kidney evaluation - Urine ACR  Never done   Diabetic kidney evaluation - eGFR measurement  12/30/2021   COVID-19 Vaccine (1 - 2024-25 season) Never done   HEMOGLOBIN A1C  06/01/2023   Cervical Cancer Screening (Pap smear)  07/31/2023    The following portions of the patient's history were reviewed and updated  as appropriate: allergies, current medications, past family history, past medical history, past social history, past surgical history, and problem list.  Review of Systems Pertinent items are noted in HPI.  Objective:  BP 118/76   Pulse 82   Ht 5\' 1"  (1.549 m)   Wt 155 lb (70.3 kg)   LMP 09/07/2022   Breastfeeding Yes   BMI 29.29 kg/m    General:  alert, cooperative, and no distress   Breasts:  not indicated  Lungs: Normal effort  Heart:  Normal rate  Abdomen: Soft, non tender    Assessment:   Postpartum exam Vaginal pain - PT referral placed. Worked with them last pregnancy and would like to start ASAP  Plan:   Essential components of care per ACOG recommendations:  1.  Mood and well being: Patient with negative depression screening today. Reviewed local resources for support.  - Patient tobacco use? No.   - hx of drug use? No.    2. Infant care and feeding:  -Patient currently breastmilk feeding? Yes. Reviewed importance of draining breast regularly to support lactation.  -Social determinants of health (SDOH) reviewed in EPIC. No concerns  3. Sexuality, contraception and birth spacing - Patient does not know want a pregnancy in the next year.  Desired family size is  unsure.  - Reviewed reproductive life planning. Reviewed contraceptive methods based on pt preferences and effectiveness.  Patient desired No Method - No Contraceptive Precautions today.   - Discussed birth spacing of 18 months  4. Sleep and fatigue -Encouraged family/partner/community support of 4 hrs of uninterrupted sleep to help with mood and fatigue  5. Physical Recovery  - Discussed patients delivery and complications. She describes her labor as mixed. - Patient had a Vaginal problems after delivery including NRFHT . Patient had a  no  laceration. Perineal healing reviewed. Patient expressed understanding - Patient has urinary incontinence? No. - Patient is safe to resume physical and sexual  activity  6.  Health Maintenance - HM due items addressed Yes - Last pap smear  Diagnosis  Date Value Ref Range Status  07/30/2020   Final   - Negative for intraepithelial lesion or malignancy (NILM)   Pap smear not done at today's visit. Declines exam today. Recommended follow up within 12 months (if waits until age 69 can do cotesting) -Breast Cancer screening indicated? No.   7. Chronic Disease/Pregnancy Condition follow up: None  Lennart Pall, MD [facility] [employer], [facility]

## 2023-07-17 ENCOUNTER — Encounter: Payer: Self-pay | Admitting: Obstetrics and Gynecology

## 2023-07-21 ENCOUNTER — Encounter: Payer: Self-pay | Admitting: Physical Therapy

## 2023-07-21 ENCOUNTER — Ambulatory Visit: Attending: Obstetrics and Gynecology | Admitting: Physical Therapy

## 2023-07-21 ENCOUNTER — Other Ambulatory Visit: Payer: Self-pay

## 2023-07-21 DIAGNOSIS — M62838 Other muscle spasm: Secondary | ICD-10-CM | POA: Diagnosis not present

## 2023-07-21 DIAGNOSIS — R293 Abnormal posture: Secondary | ICD-10-CM | POA: Diagnosis not present

## 2023-07-21 DIAGNOSIS — R279 Unspecified lack of coordination: Secondary | ICD-10-CM | POA: Insufficient documentation

## 2023-07-21 DIAGNOSIS — R102 Pelvic and perineal pain: Secondary | ICD-10-CM | POA: Diagnosis not present

## 2023-07-21 DIAGNOSIS — M6281 Muscle weakness (generalized): Secondary | ICD-10-CM | POA: Insufficient documentation

## 2023-07-21 NOTE — Therapy (Signed)
 OUTPATIENT PHYSICAL THERAPY FEMALE PELVIC EVALUATION   Patient Name: Katherine Munoz MRN: [id number] DOB:10/18/1995, 28 y.o., female Today's Date: 07/21/2023  END OF SESSION:  PT End of Session - 07/21/23 1108     Visit Number 1    Date for PT Re-Evaluation 01/21/24    Authorization Type [employer]    PT Start Time 1106    PT Stop Time 1145    PT Time Calculation (min) 39 min    Activity Tolerance Patient tolerated treatment well    Behavior During Therapy WFL for tasks assessed/performed             Past Medical History:  Diagnosis Date   Cholestasis during pregnancy    Environmental and seasonal allergies 12/19/2017   GDM (gestational diabetes mellitus) 08/01/2020   IBS (irritable bowel syndrome)    Lactose intolerance in adult 12/19/2017   Not immune to rubella    Scoliosis    Past Surgical History:  Procedure Laterality Date   TONSILLECTOMY     WISDOM TOOTH EXTRACTION     There are no active problems to display for this patient.   PCP: Alberteen Sam, FNP   REFERRING PROVIDER: Lennart Pall, MD   REFERRING DIAG: R10.2 (ICD-10-CM) - Vaginal pain  THERAPY DIAG:  Other muscle spasm  Muscle weakness (generalized)  Abnormal posture  Unspecified lack of coordination  Rationale for Evaluation and Treatment: Rehabilitation  ONSET DATE: 6 weeks postpartum  SUBJECTIVE:                                                                                                                                                                                           SUBJECTIVE STATEMENT: Just had  4th baby, had pain internally with pelvic exam during pregnancy. Had decreased cervix dilation during labor when started pushing. Did have tearing with second pregnancy. No tearing with this vaginal delivery (30yo twins, 30 yo, and 38 month old).    Fluid intake:   PAIN:  Are you having pain? No   PRECAUTIONS: Other: 6 weeks postpartum   RED FLAGS: None   WEIGHT  BEARING RESTRICTIONS: No  FALLS:  Has patient fallen in last 6 months? No  OCCUPATION: not currently   ACTIVITY LEVEL : walking 2 miles and postpartum piliates just started   PLOF: Independent  PATIENT GOALS: to be stronger and check on DRA  PERTINENT HISTORY:  GDM, IBS, x4 vaginal births Sexual abuse: No  BOWEL MOVEMENT: Pain with bowel movement: No Denies concerns  URINATION: Denies all concerns   INTERCOURSE:  Ability to have vaginal penetration Yes  Pain with intercourse: none DrynessNo Climax: not  painful Marinoff Scale: 0/3  PREGNANCY: Vaginal deliveries 4 Tearing Yes: with twin delivery , also with second baby tore in same spot but needed repaired x2 due to it opening Episiotomy No C-section deliveries 0 Currently pregnant No  PROLAPSE: None   OBJECTIVE:  Note: Objective measures were completed at Evaluation unless otherwise noted.  DIAGNOSTIC FINDINGS:    COGNITION: Overall cognitive status: Within functional limits for tasks assessed     SENSATION: Light touch: Appears intact  LUMBAR SPECIAL TESTS:  Single leg stance test: 7s Lt 9s Rt with hip drop then noted and trunk sway  FUNCTIONAL TESTS:  Functional squat - able to complete without compensatory stratgies however poor pressure management    GAIT: WFL  POSTURE: rounded shoulders, forward head, and anterior pelvic tilt   LUMBARAROM/PROM:  A/PROM A/PROM  eval  Flexion WFL  Extension WFL  Right lateral flexion WFL  Left lateral flexion WFL  Right rotation Limited by 25%  Left rotation Limited by 25%   (Blank rows = not tested)  LOWER EXTREMITY ROM:  Bil hip IR/ER limited by 25% Bil hamstrings limited by 25%  LOWER EXTREMITY MMT:  Bil hips grossly 4/5 PALPATION:   General: tightness at bil lumbar paraspinals and piriformis   Pelvic Alignment: WFL  Abdominal: [facility]                External Perineal Exam: [facility]                             Internal Pelvic Floor: TTP at  lateral vaginal walls   Patient confirms identification and approves PT to assess internal pelvic floor and treatment Yes No emotional/communication barriers or cognitive limitation. Patient is motivated to learn. Patient understands and agrees with treatment goals and plan. PT explains patient will be examined in standing, sitting, and lying down to see how their muscles and joints work. When they are ready, they will be asked to remove their underwear so PT can examine their perineum. The patient is also given the option of providing their own chaperone as one is not provided in our facility. The patient also has the right and is explained the right to defer or refuse any part of the evaluation or treatment including the internal exam. With the patient's consent, PT will use one gloved finger to gently assess the muscles of the pelvic floor, seeing how well it contracts and relaxes and if there is muscle symmetry. After, the patient will get dressed and PT and patient will discuss exam findings and plan of care. PT and patient discuss plan of care, schedule, attendance policy and HEP activities.  PELVIC MMT:   MMT eval  Vaginal 3/5, 10s (but without awareness if holding or not), 5 reps  Internal Anal Sphincter   External Anal Sphincter   Puborectalis   Diastasis Recti 2.5 finger width for 2 in above umbilicus and 1.5 for 1.5 in below. Poor muscle density noted   (Blank rows = not tested)        TONE: WFL  PROLAPSE: Not seen in hooklying   TODAY'S TREATMENT:  DATE:   07/21/2023 EVAL Examination completed, findings reviewed, pt educated on POC, HEP, and education on safe postpartum return to exercises .Pt motivated to participate in PT and agreeable to attempt recommendations.       PATIENT EDUCATION:  Education details: [id number] Person educated: Patient Education  method: Programmer, multimedia, Demonstration, Actor cues, Verbal cues, and Handouts Education comprehension: verbalized understanding, returned demonstration, verbal cues required, tactile cues required, and needs further education  HOME EXERCISE PROGRAM: [id number]  ASSESSMENT:  CLINICAL IMPRESSION: Patient is a 28 y.o. female  who was seen today for physical therapy evaluation and treatment for postpartum weakness and vaginal pain. Pt found to have decreased core and hip strength, DRA separation with minimal muscle activation of core with moderate cues, impaired posture, decreased flexibility at spine and hips. Patient consented to internal pelvic floor assessment vaginally this date and found to have decreased strength, endurance, and coordination, TTP at Rt pelvic floor. Pt would benefit from additional PT to further address deficits.    OBJECTIVE IMPAIRMENTS: decreased activity tolerance, decreased coordination, decreased endurance, decreased mobility, decreased strength, increased fascial restrictions, increased muscle spasms, impaired flexibility, improper body mechanics, postural dysfunction, and pain.   ACTIVITY LIMITATIONS: lifting, squatting, continence, and locomotion level  PARTICIPATION LIMITATIONS: community activity, yard work, and exercise, child care  PERSONAL FACTORS: Fitness, Time since onset of injury/illness/exacerbation, and 1 comorbidity: medical history  are also affecting patient's functional outcome.   REHAB POTENTIAL: Good  CLINICAL DECISION MAKING: Stable/uncomplicated  EVALUATION COMPLEXITY: Low   GOALS: Goals reviewed with patient? Yes  SHORT TERM GOALS: Target date: 08/18/23  Pt to be I with HEP.  Baseline: Goal status: INITIAL  2.  Pt to demonstrate improved coordination of pelvic floor and breathing mechanics with 10# squat with appropriate synergistic patterns to decrease pain and leakage at least 50% of the time.    Baseline:  Goal status: INITIAL  3.   Pt to be I with transverse abdominis activation in isolation x10 for improved mechanics with dynamic activity Baseline:  Goal status: INITIAL   LONG TERM GOALS: Target date: 01/21/24  Pt to be I with advanced HEP.  Baseline:  Goal status: INITIAL  2.  Pt to demonstrate at least 5/5 bil hip strength for improved pelvic stability.  Baseline:  Goal status: INITIAL  3.  Pt to demonstrate improved coordination of pelvic floor and breathing mechanics with 30# squat with appropriate synergistic patterns to decrease pain and leakage at least 75% of the time.    Baseline:  Goal status: INITIAL  4.  Pt to be I with single leg squats x10 each side for improved pelvic stability to progress toward jogging.  Baseline:  Goal status: INITIAL  5.  Pt will be able to functional actions such as jogging 30 mins without leakage or pain Baseline:  Goal status: INITIAL  6.  Pt to report tolerance to vaginal penetration with vaginal dilator of size 5 or equivalent for decreased pain with medical exams.  Baseline:  Goal status: INITIAL  PLAN:  PT FREQUENCY: 1-2x/week  PT DURATION:  12 sessions  PLANNED INTERVENTIONS: 97110-Therapeutic exercises, 97530- Therapeutic activity, 97112- Neuromuscular re-education, 97535- Self Care, 40981- Manual therapy, Patient/Family education, Taping, Dry Needling, Joint mobilization, Spinal mobilization, Scar mobilization, DME instructions, Cryotherapy, Moist heat, and Biofeedback  PLAN FOR NEXT SESSION: core and hip strength, DRA exercises, pressure management, coordination of pelvic floor   Otelia Sergeant, PT, DPT 03/13/254:16 PM

## 2023-07-25 ENCOUNTER — Encounter: Payer: Self-pay | Admitting: Physical Therapy

## 2023-07-25 ENCOUNTER — Ambulatory Visit: Admitting: Physical Therapy

## 2023-07-25 DIAGNOSIS — R293 Abnormal posture: Secondary | ICD-10-CM

## 2023-07-25 DIAGNOSIS — M6281 Muscle weakness (generalized): Secondary | ICD-10-CM | POA: Diagnosis not present

## 2023-07-25 DIAGNOSIS — M62838 Other muscle spasm: Secondary | ICD-10-CM

## 2023-07-25 DIAGNOSIS — R279 Unspecified lack of coordination: Secondary | ICD-10-CM | POA: Diagnosis not present

## 2023-07-25 DIAGNOSIS — R102 Pelvic and perineal pain: Secondary | ICD-10-CM | POA: Diagnosis not present

## 2023-07-25 NOTE — Therapy (Signed)
 OUTPATIENT PHYSICAL THERAPY FEMALE PELVIC TREATMENT   Patient Name: Katherine Munoz MRN: [id number] DOB:1996-03-13, 28 y.o., female Today's Date: 07/25/2023  END OF SESSION:  PT End of Session - 07/25/23 0838     Visit Number 2    Date for PT Re-Evaluation 01/21/24    Authorization Type [employer]    PT Start Time 0845    PT Stop Time 0928    PT Time Calculation (min) 43 min    Activity Tolerance Patient tolerated treatment well    Behavior During Therapy [facility] for tasks assessed/performed             Past Medical History:  Diagnosis Date   Cholestasis during pregnancy    Environmental and seasonal allergies 12/19/2017   GDM (gestational diabetes mellitus) 08/01/2020   IBS (irritable bowel syndrome)    Lactose intolerance in adult 12/19/2017   Not immune to rubella    Scoliosis    Past Surgical History:  Procedure Laterality Date   TONSILLECTOMY     WISDOM TOOTH EXTRACTION     There are no active problems to display for this patient.   PCP: Alberteen Sam, FNP   REFERRING PROVIDER: Lennart Pall, MD   REFERRING DIAG: R10.2 (ICD-10-CM) - Vaginal pain  THERAPY DIAG:  Other muscle spasm  Muscle weakness (generalized)  Abnormal posture  Rationale for Evaluation and Treatment: Rehabilitation  ONSET DATE: 6 weeks postpartum  SUBJECTIVE:                                                                                                                                                                                           SUBJECTIVE STATEMENT: No pain today, is still having internal pain with penetration but did not want to do internal today.   PAIN:  Are you having pain? No   PRECAUTIONS: Other: 6 weeks postpartum   RED FLAGS: None   WEIGHT BEARING RESTRICTIONS: No  FALLS:  Has patient fallen in last 6 months? No  OCCUPATION: not currently   ACTIVITY LEVEL : walking 2 miles and postpartum piliates just started   PLOF:  Independent  PATIENT GOALS: to be stronger and check on DRA  PERTINENT HISTORY:  GDM, IBS, x4 vaginal births Sexual abuse: No  BOWEL MOVEMENT: Pain with bowel movement: No Denies concerns  URINATION: Denies all concerns   INTERCOURSE:  Ability to have vaginal penetration Yes  Pain with intercourse: none DrynessNo Climax: not painful Marinoff Scale: 0/3  PREGNANCY: Vaginal deliveries 4 Tearing Yes: with twin delivery , also with second baby tore in same spot but needed repaired x2 due to it opening Episiotomy No C-section  deliveries 0 Currently pregnant No  PROLAPSE: None   OBJECTIVE:  Note: Objective measures were completed at Evaluation unless otherwise noted.  DIAGNOSTIC FINDINGS:    COGNITION: Overall cognitive status: Within functional limits for tasks assessed     SENSATION: Light touch: Appears intact  LUMBAR SPECIAL TESTS:  Single leg stance test: 7s Lt 9s Rt with hip drop then noted and trunk sway  FUNCTIONAL TESTS:  Functional squat - able to complete without compensatory stratgies however poor pressure management    GAIT: WFL  POSTURE: rounded shoulders, forward head, and anterior pelvic tilt   LUMBARAROM/PROM:  A/PROM A/PROM  eval  Flexion WFL  Extension WFL  Right lateral flexion WFL  Left lateral flexion WFL  Right rotation Limited by 25%  Left rotation Limited by 25%   (Blank rows = not tested)  LOWER EXTREMITY ROM:  Bil hip IR/ER limited by 25% Bil hamstrings limited by 25%  LOWER EXTREMITY MMT:  Bil hips grossly 4/5 PALPATION:   General: tightness at bil lumbar paraspinals and piriformis   Pelvic Alignment: WFL  Abdominal: [facility]                External Perineal Exam: [facility]                             Internal Pelvic Floor: TTP at lateral vaginal walls   Patient confirms identification and approves PT to assess internal pelvic floor and treatment Yes No emotional/communication barriers or cognitive limitation. Patient  is motivated to learn. Patient understands and agrees with treatment goals and plan. PT explains patient will be examined in standing, sitting, and lying down to see how their muscles and joints work. When they are ready, they will be asked to remove their underwear so PT can examine their perineum. The patient is also given the option of providing their own chaperone as one is not provided in our facility. The patient also has the right and is explained the right to defer or refuse any part of the evaluation or treatment including the internal exam. With the patient's consent, PT will use one gloved finger to gently assess the muscles of the pelvic floor, seeing how well it contracts and relaxes and if there is muscle symmetry. After, the patient will get dressed and PT and patient will discuss exam findings and plan of care. PT and patient discuss plan of care, schedule, attendance policy and HEP activities.  PELVIC MMT:   MMT eval  Vaginal 3/5, 10s (but without awareness if holding or not), 5 reps  Internal Anal Sphincter   External Anal Sphincter   Puborectalis   Diastasis Recti 2.5 finger width for 2 in above umbilicus and 1.5 for 1.5 in below. Poor muscle density noted   (Blank rows = not tested)        TONE: WFL  PROLAPSE: Not seen in hooklying   TODAY'S TREATMENT:  DATE:   07/25/23: Opp hand/knee ball press 2x10 + exhale  Bridges x10 +marching green band difficulty maintaining transverse abdominis activation then removed band and did transverse abdominis with bridge only x10 Hooklying green band bil shoulder extension 2x10 Deadbugs - tactile cues for transverse abdominis activation throughout x10 Green band palloffs 2x10 >rotational palloffs 2x10 green band  10# squats 2x10 +exhale x10 lunges each leg + exhale Green band bil shoulder horizontal abduction  +transverse abdominis activation standing 2x10 [place] L2 in sitting at bil upper traps   07/21/2023 EVAL Examination completed, findings reviewed, pt educated on POC, HEP, and education on safe postpartum return to exercises .Pt motivated to participate in PT and agreeable to attempt recommendations.       PATIENT EDUCATION:  Education details: 6F2V5PEG Person educated: Patient Education method: Programmer, multimedia, Demonstration, Actor cues, Verbal cues, and Handouts Education comprehension: verbalized understanding, returned demonstration, verbal cues required, tactile cues required, and needs further education  HOME EXERCISE PROGRAM: [id number]  ASSESSMENT:  CLINICAL IMPRESSION: Patient is a 28 y.o. female  who was seen today for physical therapy evaluation and treatment for postpartum weakness and vaginal pain. Pt tolerated session well today without pain moderate verbal cues and tactile cues for transverse abdominis activation. Does demonstrate rounded shoulder and forward head which limits core activation but improved with manual work at traps. Pt would benefit from additional PT to further address deficits.    OBJECTIVE IMPAIRMENTS: decreased activity tolerance, decreased coordination, decreased endurance, decreased mobility, decreased strength, increased fascial restrictions, increased muscle spasms, impaired flexibility, improper body mechanics, postural dysfunction, and pain.   ACTIVITY LIMITATIONS: lifting, squatting, continence, and locomotion level  PARTICIPATION LIMITATIONS: community activity, yard work, and exercise, child care  PERSONAL FACTORS: Fitness, Time since onset of injury/illness/exacerbation, and 1 comorbidity: medical history  are also affecting patient's functional outcome.   REHAB POTENTIAL: Good  CLINICAL DECISION MAKING: Stable/uncomplicated  EVALUATION COMPLEXITY: Low   GOALS: Goals reviewed with patient? Yes  SHORT TERM GOALS: Target date:  08/18/23  Pt to be I with HEP.  Baseline: Goal status: INITIAL  2.  Pt to demonstrate improved coordination of pelvic floor and breathing mechanics with 10# squat with appropriate synergistic patterns to decrease pain and leakage at least 50% of the time.    Baseline:  Goal status: INITIAL  3.  Pt to be I with transverse abdominis activation in isolation x10 for improved mechanics with dynamic activity Baseline:  Goal status: INITIAL   LONG TERM GOALS: Target date: 01/21/24  Pt to be I with advanced HEP.  Baseline:  Goal status: INITIAL  2.  Pt to demonstrate at least 5/5 bil hip strength for improved pelvic stability.  Baseline:  Goal status: INITIAL  3.  Pt to demonstrate improved coordination of pelvic floor and breathing mechanics with 30# squat with appropriate synergistic patterns to decrease pain and leakage at least 75% of the time.    Baseline:  Goal status: INITIAL  4.  Pt to be I with single leg squats x10 each side for improved pelvic stability to progress toward jogging.  Baseline:  Goal status: INITIAL  5.  Pt will be able to functional actions such as jogging 30 mins without leakage or pain Baseline:  Goal status: INITIAL  6.  Pt to report tolerance to vaginal penetration with vaginal dilator of size 5 or equivalent for decreased pain with medical exams.  Baseline:  Goal status: INITIAL  PLAN:  PT FREQUENCY: 1-2x/week  PT DURATION:  12 sessions  PLANNED INTERVENTIONS: 97110-Therapeutic exercises, 97530- Therapeutic activity, 97112- Neuromuscular re-education, 97535- Self Care, 69629- Manual therapy, Patient/Family education, Taping, Dry Needling, Joint mobilization, Spinal mobilization, Scar mobilization, DME instructions, Cryotherapy, Moist heat, and Biofeedback  PLAN FOR NEXT SESSION: core and hip strength, DRA exercises, pressure management, coordination of pelvic floor   Otelia Sergeant, PT, DPT 07/24/2508:14 AM

## 2023-08-01 ENCOUNTER — Encounter: Payer: Self-pay | Admitting: Physical Therapy

## 2023-08-01 ENCOUNTER — Ambulatory Visit: Payer: Self-pay | Admitting: Physical Therapy

## 2023-08-01 DIAGNOSIS — M6281 Muscle weakness (generalized): Secondary | ICD-10-CM | POA: Diagnosis not present

## 2023-08-01 DIAGNOSIS — R293 Abnormal posture: Secondary | ICD-10-CM

## 2023-08-01 DIAGNOSIS — R279 Unspecified lack of coordination: Secondary | ICD-10-CM | POA: Diagnosis not present

## 2023-08-01 DIAGNOSIS — M62838 Other muscle spasm: Secondary | ICD-10-CM | POA: Diagnosis not present

## 2023-08-01 DIAGNOSIS — R102 Pelvic and perineal pain: Secondary | ICD-10-CM | POA: Diagnosis not present

## 2023-08-01 NOTE — Therapy (Addendum)
 " OUTPATIENT PHYSICAL THERAPY FEMALE PELVIC TREATMENT   Patient Name: Katherine Munoz MRN: [id number] DOB:07-21-1995, 28 y.o., female Today's Date: 08/01/2023  END OF SESSION:  PT End of Session - 08/01/23 1021     Visit Number 3    Date for PT Re-Evaluation 01/21/24    Authorization Type [employer]    PT Start Time 1017    PT Stop Time 1058    PT Time Calculation (min) 41 min    Activity Tolerance Patient tolerated treatment well    Behavior During Therapy WFL for tasks assessed/performed              Past Medical History:  Diagnosis Date   Cholestasis during pregnancy    Environmental and seasonal allergies 12/19/2017   GDM (gestational diabetes mellitus) 08/01/2020   IBS (irritable bowel syndrome)    Lactose intolerance in adult 12/19/2017   Not immune to rubella    Scoliosis    Past Surgical History:  Procedure Laterality Date   TONSILLECTOMY     WISDOM TOOTH EXTRACTION     There are no active problems to display for this patient.   PCP: Holmes Daved Fritz, FNP   REFERRING PROVIDER: Erik Kieth BROCKS, MD   REFERRING DIAG: R10.2 (ICD-10-CM) - Vaginal pain  THERAPY DIAG:  Muscle weakness (generalized)  Abnormal posture  Unspecified lack of coordination  Rationale for Evaluation and Treatment: Rehabilitation  ONSET DATE: 6 weeks postpartum  SUBJECTIVE:                                                                                                                                                                                           SUBJECTIVE STATEMENT: Did have some vaginal bleeding post last session but unsure if this was period returning or not.   PAIN:  Are you having pain? No   PRECAUTIONS: Other: 6 weeks postpartum   RED FLAGS: None   WEIGHT BEARING RESTRICTIONS: No  FALLS:  Has patient fallen in last 6 months? No  OCCUPATION: not currently   ACTIVITY LEVEL : walking 2 miles and postpartum piliates just started   PLOF:  Independent  PATIENT GOALS: to be stronger and check on DRA  PERTINENT HISTORY:  GDM, IBS, x4 vaginal births Sexual abuse: No  BOWEL MOVEMENT: Pain with bowel movement: No Denies concerns  URINATION: Denies all concerns   INTERCOURSE:  Ability to have vaginal penetration Yes  Pain with intercourse: none DrynessNo Climax: not painful Marinoff Scale: 0/3  PREGNANCY: Vaginal deliveries 4 Tearing Yes: with twin delivery , also with second baby tore in same spot but needed repaired x2 due to it opening Episiotomy  No C-section deliveries 0 Currently pregnant No  PROLAPSE: None   OBJECTIVE:  Note: Objective measures were completed at Evaluation unless otherwise noted.  DIAGNOSTIC FINDINGS:    COGNITION: Overall cognitive status: Within functional limits for tasks assessed     SENSATION: Light touch: Appears intact  LUMBAR SPECIAL TESTS:  Single leg stance test: 7s Lt 9s Rt with hip drop then noted and trunk sway  FUNCTIONAL TESTS:  Functional squat - able to complete without compensatory stratgies however poor pressure management    GAIT: WFL  POSTURE: rounded shoulders, forward head, and anterior pelvic tilt   LUMBARAROM/PROM:  A/PROM A/PROM  eval  Flexion WFL  Extension WFL  Right lateral flexion WFL  Left lateral flexion WFL  Right rotation Limited by 25%  Left rotation Limited by 25%   (Blank rows = not tested)  LOWER EXTREMITY ROM:  Bil hip IR/ER limited by 25% Bil hamstrings limited by 25%  LOWER EXTREMITY MMT:  Bil hips grossly 4/5 PALPATION:   General: tightness at bil lumbar paraspinals and piriformis   Pelvic Alignment: WFL  Abdominal: [facility]                External Perineal Exam: [facility]                             Internal Pelvic Floor: TTP at lateral vaginal walls   Patient confirms identification and approves PT to assess internal pelvic floor and treatment Yes No emotional/communication barriers or cognitive limitation. Patient  is motivated to learn. Patient understands and agrees with treatment goals and plan. PT explains patient will be examined in standing, sitting, and lying down to see how their muscles and joints work. When they are ready, they will be asked to remove their underwear so PT can examine their perineum. The patient is also given the option of providing their own chaperone as one is not provided in our facility. The patient also has the right and is explained the right to defer or refuse any part of the evaluation or treatment including the internal exam. With the patient's consent, PT will use one gloved finger to gently assess the muscles of the pelvic floor, seeing how well it contracts and relaxes and if there is muscle symmetry. After, the patient will get dressed and PT and patient will discuss exam findings and plan of care. PT and patient discuss plan of care, schedule, attendance policy and HEP activities.  PELVIC MMT:   MMT eval  Vaginal 3/5, 10s (but without awareness if holding or not), 5 reps  Internal Anal Sphincter   External Anal Sphincter   Puborectalis   Diastasis Recti 2.5 finger width for 2 in above umbilicus and 1.5 for 1.5 in below. Poor muscle density noted   (Blank rows = not tested)        TONE: WFL  PROLAPSE: Not seen in hooklying   TODAY'S TREATMENT:  DATE:   08/01/23:  Hooklying diaphragmatic breathing x20 Opp hand/knee ball press 2x10 +exhale  Hooklying bil shoulder horizontal abduction green band with transverse abdominis activation and exhale 2x10 2x10 bridges  Sidelying clam with transverse abdominis and exhale 2x10 each Small power cord 2x10 hooklying bil shoulder ext - moderate cues for core activation with tactile cues Hooklying heel taps 2x10 Bird dogs 2x10 - mod cues for core and decreased rotation 2x10 Sit to stand transverse  abdominis activation and exhale Palloffs small power cord x10 standing  Quad donkey kicks x10 each  07/25/23: Opp hand/knee ball press 2x10 + exhale  Bridges x10 +marching green band difficulty maintaining transverse abdominis activation then removed band and did transverse abdominis with bridge only x10 Hooklying green band bil shoulder extension 2x10 Deadbugs - tactile cues for transverse abdominis activation throughout x10 Green band palloffs 2x10 >rotational palloffs 2x10 green band  10# squats 2x10 +exhale x10 lunges each leg + exhale Green band bil shoulder horizontal abduction +transverse abdominis activation standing 2x10 Addaday L2 in sitting at bil upper traps   07/21/2023 EVAL Examination completed, findings reviewed, pt educated on POC, HEP, and education on safe postpartum return to exercises .Pt motivated to participate in PT and agreeable to attempt recommendations.       PATIENT EDUCATION:  Education details: [id number] Person educated: Patient Education method: Programmer, Multimedia, Demonstration, Actor cues, Verbal cues, and Handouts Education comprehension: verbalized understanding, returned demonstration, verbal cues required, tactile cues required, and needs further education  HOME EXERCISE PROGRAM: [id number]  ASSESSMENT:  CLINICAL IMPRESSION: Patient is a 28 y.o. female  who was seen today for physical therapy evaluation and treatment for postpartum weakness and vaginal pain. Pt tolerated session well today without pain moderate verbal cues and tactile cues for transverse abdominis activation and techniques. Does demonstrate rounded shoulder and forward head which limits core activation but improved with manual work at traps. Pt would benefit from additional PT to further address deficits.    OBJECTIVE IMPAIRMENTS: decreased activity tolerance, decreased coordination, decreased endurance, decreased mobility, decreased strength, increased fascial restrictions, increased  muscle spasms, impaired flexibility, improper body mechanics, postural dysfunction, and pain.   ACTIVITY LIMITATIONS: lifting, squatting, continence, and locomotion level  PARTICIPATION LIMITATIONS: community activity, yard work, and exercise, child care  PERSONAL FACTORS: Fitness, Time since onset of injury/illness/exacerbation, and 1 comorbidity: medical history are also affecting patient's functional outcome.   REHAB POTENTIAL: Good  CLINICAL DECISION MAKING: Stable/uncomplicated  EVALUATION COMPLEXITY: Low   GOALS: Goals reviewed with patient? Yes  SHORT TERM GOALS: Target date: 08/18/23  Pt to be I with HEP.  Baseline: Goal status: INITIAL  2.  Pt to demonstrate improved coordination of pelvic floor and breathing mechanics with 10# squat with appropriate synergistic patterns to decrease pain and leakage at least 50% of the time.    Baseline:  Goal status: INITIAL  3.  Pt to be I with transverse abdominis activation in isolation x10 for improved mechanics with dynamic activity Baseline:  Goal status: INITIAL   LONG TERM GOALS: Target date: 01/21/24  Pt to be I with advanced HEP.  Baseline:  Goal status: INITIAL  2.  Pt to demonstrate at least 5/5 bil hip strength for improved pelvic stability.  Baseline:  Goal status: INITIAL  3.  Pt to demonstrate improved coordination of pelvic floor and breathing mechanics with 30# squat with appropriate synergistic patterns to decrease pain and leakage at least 75% of the time.    Baseline:  Goal status: INITIAL  4.  Pt to be I with single leg squats x10 each side for improved pelvic stability to progress toward jogging.  Baseline:  Goal status: INITIAL  5.  Pt will be able to functional actions such as jogging 30 mins without leakage or pain Baseline:  Goal status: INITIAL  6.  Pt to report tolerance to vaginal penetration with vaginal dilator of size 5 or equivalent for decreased pain with medical exams.  Baseline:   Goal status: INITIAL  PLAN:  PT FREQUENCY: 1-2x/week  PT DURATION: 12 sessions  PLANNED INTERVENTIONS: 97110-Therapeutic exercises, 97530- Therapeutic activity, 97112- Neuromuscular re-education, 97535- Self Care, [id number]- Manual therapy, Patient/Family education, Taping, Dry Needling, Joint mobilization, Spinal mobilization, Scar mobilization, DME instructions, Cryotherapy, Moist heat, and Biofeedback  PLAN FOR NEXT SESSION: core and hip strength, DRA exercises, pressure management, coordination of pelvic floor   Darryle Navy, PT, DPT 07/31/2509:36 AM    PHYSICAL THERAPY DISCHARGE SUMMARY  Visits from Start of Care: 3  Current functional level related to goals / functional outcomes: Unable to reassess as pt requested DC    Remaining deficits: Unable to reassess    Education / Equipment: HEP   Patient agrees to discharge. Patient goals were partially met. Patient is being discharged due to being pleased with the current functional level.  .hal  "

## 2023-08-02 ENCOUNTER — Ambulatory Visit: Admitting: Physical Therapy

## 2023-08-10 ENCOUNTER — Encounter: Admitting: Physical Therapy

## 2023-08-11 ENCOUNTER — Ambulatory Visit: Admitting: Physical Therapy

## 2023-08-15 ENCOUNTER — Encounter: Payer: Self-pay | Admitting: Physical Therapy

## 2023-09-08 ENCOUNTER — Ambulatory Visit: Admitting: Physical Therapy

## 2023-09-15 ENCOUNTER — Encounter: Admitting: Physical Therapy

## 2023-09-20 ENCOUNTER — Encounter: Admitting: Physical Therapy

## 2023-09-27 ENCOUNTER — Encounter: Admitting: Physical Therapy

## 2023-10-05 ENCOUNTER — Encounter: Admitting: Physical Therapy

## 2023-10-13 ENCOUNTER — Encounter: Admitting: Physical Therapy

## 2023-10-20 ENCOUNTER — Encounter: Admitting: Physical Therapy

## 2023-10-27 ENCOUNTER — Encounter: Admitting: Physical Therapy

## 2023-11-10 ENCOUNTER — Encounter: Admitting: Physical Therapy

## 2023-11-17 ENCOUNTER — Encounter: Admitting: Physical Therapy

## 2024-02-16 ENCOUNTER — Telehealth: Payer: Self-pay | Admitting: Family Medicine

## 2024-02-16 ENCOUNTER — Encounter: Payer: Self-pay | Admitting: Obstetrics and Gynecology

## 2024-02-16 ENCOUNTER — Ambulatory Visit

## 2024-02-16 VITALS — BP 119/80 | HR 77 | Ht 61.0 in | Wt 142.0 lb

## 2024-02-16 DIAGNOSIS — Z3201 Encounter for pregnancy test, result positive: Secondary | ICD-10-CM

## 2024-02-16 DIAGNOSIS — N926 Irregular menstruation, unspecified: Secondary | ICD-10-CM

## 2024-02-16 LAB — POCT URINE PREGNANCY: Preg Test, Ur: POSITIVE — AB

## 2024-02-16 NOTE — Telephone Encounter (Signed)
 Called patient and left detailed message about upcoming appointment. Left office number for patient to call if she needs to reschedule.

## 2024-02-16 NOTE — Progress Notes (Signed)
 Phila Grabel here for a UPT. Pt had a positive upt at home. LMP is 01/11/2024.     UPT in office Positive.    Reviewed medications and informed to start a PNV, if not already. Pt to follow up in 6+ weeks for New OB visit. Patient given safe otc medications during pregnancy. RN reviewed when to notify provider and or go to [facility]U.     Silvano LELON Piano, RN

## 2024-03-07 ENCOUNTER — Other Ambulatory Visit: Payer: Self-pay

## 2024-03-07 ENCOUNTER — Ambulatory Visit (INDEPENDENT_AMBULATORY_CARE_PROVIDER_SITE_OTHER)

## 2024-03-07 DIAGNOSIS — Z3A01 Less than 8 weeks gestation of pregnancy: Secondary | ICD-10-CM

## 2024-03-07 DIAGNOSIS — Z3201 Encounter for pregnancy test, result positive: Secondary | ICD-10-CM

## 2024-03-07 DIAGNOSIS — Z3491 Encounter for supervision of normal pregnancy, unspecified, first trimester: Secondary | ICD-10-CM | POA: Diagnosis not present

## 2024-03-17 ENCOUNTER — Encounter: Payer: Self-pay | Admitting: Obstetrics and Gynecology

## 2024-03-17 DIAGNOSIS — Z8632 Personal history of gestational diabetes: Secondary | ICD-10-CM | POA: Insufficient documentation

## 2024-03-17 DIAGNOSIS — Z3480 Encounter for supervision of other normal pregnancy, unspecified trimester: Secondary | ICD-10-CM | POA: Insufficient documentation

## 2024-03-21 ENCOUNTER — Other Ambulatory Visit (HOSPITAL_COMMUNITY)
Admission: RE | Admit: 2024-03-21 | Discharge: 2024-03-21 | Disposition: A | Source: Ambulatory Visit | Attending: Obstetrics and Gynecology | Admitting: Obstetrics and Gynecology

## 2024-03-21 ENCOUNTER — Encounter: Payer: Self-pay | Admitting: Obstetrics and Gynecology

## 2024-03-21 ENCOUNTER — Ambulatory Visit (INDEPENDENT_AMBULATORY_CARE_PROVIDER_SITE_OTHER): Admitting: Obstetrics and Gynecology

## 2024-03-21 VITALS — BP 116/79 | HR 93 | Wt 143.0 lb

## 2024-03-21 DIAGNOSIS — Z3A08 8 weeks gestation of pregnancy: Secondary | ICD-10-CM | POA: Diagnosis not present

## 2024-03-21 DIAGNOSIS — Z8719 Personal history of other diseases of the digestive system: Secondary | ICD-10-CM

## 2024-03-21 DIAGNOSIS — Z3480 Encounter for supervision of other normal pregnancy, unspecified trimester: Secondary | ICD-10-CM | POA: Diagnosis present

## 2024-03-21 DIAGNOSIS — Z8632 Personal history of gestational diabetes: Secondary | ICD-10-CM | POA: Diagnosis not present

## 2024-03-21 DIAGNOSIS — Z8759 Personal history of other complications of pregnancy, childbirth and the puerperium: Secondary | ICD-10-CM | POA: Diagnosis not present

## 2024-03-21 NOTE — Progress Notes (Signed)
 History:  Jolita Haefner is a 28 y.o. [id number] at [redacted]w[redacted]d by early ultrasound being seen today for her first obstetrical visit.   Patient does intend to breast feed.   Pregnancy history fully reviewed. Obstetrical history is significant for 3 FT SVD, including a set of twins. She had cholestasis and GDM with all but her last.   Patient reports pain with recent intercourse. She felt inside and seemed like cervix was low so she was concerned about possible prolapse given that she pushed before complete dilation with her last delivery.   HISTORY: OB History  Gravida Para Term Preterm AB Living  5 3 3  0 1 4  SAB IAB Ectopic Multiple Live Births  1 0 0 1 4    # Outcome Date GA Lbr Len/2nd Weight Sex Type Anes PTL Lv  5 Current           4 Term 06/10/23 [redacted]w[redacted]d  7 lb 11.1 oz (3.49 kg) F Vag-Spont EPI  LIV     Name: Trinitee Horgan     Apgar1: 7  Apgar5: 9  3 Term 02/08/21 [redacted]w[redacted]d 01:41 / 00:17 7 lb 5.3 oz (3.325 kg) F Vag-Spont EPI  LIV     Name: Quayle,GIRL Jarrett     Apgar1: 8  Apgar5: 9  2 SAB 05/2020 [redacted]w[redacted]d         1A Term 02/18/16 [redacted]w[redacted]d 07:10 / 01:17 6 lb 2.9 oz (2.805 kg) M Vag-Spont EPI, Local  LIV     Name: HELMS,BOY Cera     Apgar1: 8  Apgar5: 9  1B Term 02/18/16 [redacted]w[redacted]d 07:10 / 01:23 5 lb 14.2 oz (2.67 kg) M Vag-Spont Local, EPI  LIV     Name: HELMS,BOYB Ajwa     Apgar1: 1  Apgar5: 9     Lab Results  Component Value Date   DIAGPAP  07/30/2020    - Negative for intraepithelial lesion or malignancy (NILM)     Past Medical History:  Diagnosis Date   Cholestasis during pregnancy    Environmental and seasonal allergies 12/19/2017   GDM (gestational diabetes mellitus) 08/01/2020   IBS (irritable bowel syndrome)    Lactose intolerance in adult 12/19/2017   Not immune to rubella    Scoliosis    Past Surgical History:  Procedure Laterality Date   TONSILLECTOMY     WISDOM TOOTH EXTRACTION     Family History  Problem Relation Age of Onset   Healthy Mother    Healthy  Father    Heart disease Maternal Grandfather    Breast cancer Other    Social History   Tobacco Use   Smoking status: Never   Smokeless tobacco: Never  Vaping Use   Vaping status: Never Used  Substance Use Topics   Alcohol use: Not Currently    Comment: Socially   Drug use: No   No Known Allergies Current Outpatient Medications on File Prior to Visit  Medication Sig Dispense Refill   magnesium citrate SOLN Take 296 mLs by mouth once.     Prenatal Vit-Fe Fumarate-FA (PRENATAL VITAMIN PO) Take by mouth.     No current facility-administered medications on file prior to visit.    Review of Systems Pertinent items noted in HPI and remainder of comprehensive ROS otherwise negative.  Physical Exam:   Vitals:   03/21/24 1029  BP: 116/79  Pulse: 93  Weight: 143 lb (64.9 kg)   +FHT  General: well-developed, well-nourished female in no acute distress  Breasts:  normal appearance, no masses or tenderness bilaterally  Skin: normal coloration and turgor, no rashes  Neurologic: oriented, normal, negative, normal mood  Extremities: normal strength, tone, and muscle mass, ROM of all joints is normal  HEENT PERRLA, extraocular movement intact and sclera clear, anicteric  Neck supple and no masses  Cardiovascular: regular rate and rhythm  Respiratory:  no respiratory distress, normal breath sounds  Abdomen: soft, non-tender; bowel sounds normal; no masses,  no organomegaly  Pelvic: normal external genitalia, no lesions, normal vaginal mucosa, normal vaginal discharge, normal cervix, pap smear done. Uterine size:  aga. No evidence of prolapse on exam   Assessment:    Pregnancy: [id number] Patient Active Problem List   Diagnosis Date Noted   Supervision of other normal pregnancy, antepartum 03/17/2024   History of gestational diabetes 03/17/2024   History of cholestasis during pregnancy 03/09/2021     Plan:    1. Supervision of other normal pregnancy, antepartum  (Primary) Initial labs ordered. Declines flu shot. Discussed risk of flu in pregnancy. Reviewed availability of tamiflu if sx or exposure.  If persistent pain with intercourse, discussed consider PT.  Continue prenatal vitamins. Problem list reviewed and updated. Genetic Screening discussed, NIPS: ordered. Ultrasound discussed; fetal anatomic survey: ordered. Anticipatory guidance about prenatal visits given including labs, ultrasounds, and testing. Discussed usage of Babyscripts and virtual visits - Cytology - PAP( [place]) - CBC/D/Plt+RPR+Rh+ABO+RubIgG... - Culture, OB Urine - US  MFM OB DETAIL +14 WK; Future - Hemoglobin A1c  2. [redacted] weeks gestation of pregnancy - Cytology - PAP( [place]) - CBC/D/Plt+RPR+Rh+ABO+RubIgG... - Culture, OB Urine - US  MFM OB DETAIL +14 WK; Future - Hemoglobin A1c  3. History of cholestasis during pregnancy Discussed risk of recurrence  4. History of gestational diabetes Check early A1C. Reviewed if abnormal, we will recommend early 2 hr.  - US  MFM OB DETAIL +14 WK; Future     The nature of [place] [facility] for [facility] with multiple MDs and Advanced Practice Providers was explained to patient; also emphasized that residents, students are part of our team. Routine obstetric precautions reviewed. Encouraged to seek out care at office or emergency room [facility] preferred) for urgent and/or emergent concerns. No follow-ups on file.    Vina Solian, MD, FACOG Obstetrician & Gynecologist, [facility] for [facility], [facility]

## 2024-03-22 ENCOUNTER — Ambulatory Visit: Payer: Self-pay | Admitting: Obstetrics and Gynecology

## 2024-03-22 LAB — CBC/D/PLT+RPR+RH+ABO+RUBIGG...
Antibody Screen: NEGATIVE
Basophils Absolute: 0 x10E3/uL (ref 0.0–0.2)
Basos: 0 %
EOS (ABSOLUTE): 0.1 x10E3/uL (ref 0.0–0.4)
Eos: 1 %
HCV Ab: NONREACTIVE
HIV Screen 4th Generation wRfx: NONREACTIVE
Hematocrit: 39.6 % (ref 34.0–46.6)
Hemoglobin: 13.2 g/dL (ref 11.1–15.9)
Hepatitis B Surface Ag: NEGATIVE
Immature Grans (Abs): 0 x10E3/uL (ref 0.0–0.1)
Immature Granulocytes: 0 %
Lymphocytes Absolute: 1.3 x10E3/uL (ref 0.7–3.1)
Lymphs: 15 %
MCH: 29.6 pg (ref 26.6–33.0)
MCHC: 33.3 g/dL (ref 31.5–35.7)
MCV: 89 fL (ref 79–97)
Monocytes Absolute: 0.5 x10E3/uL (ref 0.1–0.9)
Monocytes: 6 %
Neutrophils Absolute: 6.6 x10E3/uL (ref 1.4–7.0)
Neutrophils: 78 %
Platelets: 222 x10E3/uL (ref 150–450)
RBC: 4.46 x10E6/uL (ref 3.77–5.28)
RDW: 13 % (ref 11.7–15.4)
RPR Ser Ql: NONREACTIVE
Rh Factor: POSITIVE
Rubella Antibodies, IGG: 0.97 {index} — ABNORMAL LOW (ref >0.99)
WBC: 8.5 x10E3/uL (ref 3.4–10.8)

## 2024-03-22 LAB — CYTOLOGY - PAP
Chlamydia: NEGATIVE
Comment: NEGATIVE
Comment: NORMAL
Diagnosis: NEGATIVE
Neisseria Gonorrhea: NEGATIVE

## 2024-03-22 LAB — HEMOGLOBIN A1C
Est. average glucose Bld gHb Est-mCnc: 105 mg/dL
Hgb A1c MFr Bld: 5.3 % (ref 4.8–5.6)

## 2024-03-22 LAB — HCV INTERPRETATION

## 2024-03-23 LAB — CULTURE, OB URINE

## 2024-03-23 LAB — URINE CULTURE, OB REFLEX: Organism ID, Bacteria: NO GROWTH

## 2024-03-26 ENCOUNTER — Ambulatory Visit: Payer: Self-pay | Admitting: Obstetrics and Gynecology

## 2024-03-29 ENCOUNTER — Encounter: Payer: Self-pay | Admitting: Obstetrics and Gynecology

## 2024-04-17 ENCOUNTER — Ambulatory Visit: Admitting: Obstetrics and Gynecology

## 2024-04-17 VITALS — BP 123/73 | HR 76 | Wt 145.0 lb

## 2024-04-17 DIAGNOSIS — O468X1 Other antepartum hemorrhage, first trimester: Secondary | ICD-10-CM | POA: Diagnosis not present

## 2024-04-17 DIAGNOSIS — Z8632 Personal history of gestational diabetes: Secondary | ICD-10-CM | POA: Diagnosis not present

## 2024-04-17 DIAGNOSIS — Z2839 Other underimmunization status: Secondary | ICD-10-CM | POA: Diagnosis not present

## 2024-04-17 DIAGNOSIS — Z3490 Encounter for supervision of normal pregnancy, unspecified, unspecified trimester: Secondary | ICD-10-CM

## 2024-04-17 DIAGNOSIS — Z3480 Encounter for supervision of other normal pregnancy, unspecified trimester: Secondary | ICD-10-CM

## 2024-04-17 DIAGNOSIS — Z3A12 12 weeks gestation of pregnancy: Secondary | ICD-10-CM

## 2024-04-17 DIAGNOSIS — O418X10 Other specified disorders of amniotic fluid and membranes, first trimester, not applicable or unspecified: Secondary | ICD-10-CM | POA: Diagnosis not present

## 2024-04-17 NOTE — Progress Notes (Signed)
 PRENATAL VISIT NOTE  Subjective:  Katherine Munoz is a 28 y.o. H4E6985 at [redacted]w[redacted]d being seen today for ongoing prenatal care.  She is currently monitored for the following issues for this low-risk pregnancy and has History of cholestasis during pregnancy; Rubella non-immune status, antepartum; Supervision of other normal pregnancy, antepartum; and History of gestational diabetes on their problem list.  Patient reports no complaints.   . Vag. Bleeding: None.   . Denies leaking of fluid.   The following portions of the patient's history were reviewed and updated as appropriate: allergies, current medications, past family history, past medical history, past social history, past surgical history and problem list.   Objective:   Vitals:   04/17/24 1041  BP: 123/73  Pulse: 76  Weight: 145 lb (65.8 kg)    Fetal Status:           General: Alert, oriented and cooperative. Patient is in no acute distress.  Skin: Skin is warm and dry. No rash noted.   Cardiovascular: Normal heart rate noted  Respiratory: Normal respiratory effort, no problems with respiration noted  Abdomen: Soft, gravid, appropriate for gestational age.        Pelvic: Cervical exam deferred        Extremities: Normal range of motion.  Edema: None  Mental Status: Normal mood and affect. Normal behavior. Normal judgment and thought content.      03/21/2024   10:40 AM 11/29/2022   10:28 AM 03/31/2016    8:10 AM  Depression screen PHQ 2/9  Decreased Interest 0 0 0  Down, Depressed, Hopeless 0 0 0  PHQ - 2 Score 0 0 0  Altered sleeping 1 0 1  Tired, decreased energy 1 2 0  Change in appetite 0 0 0  Feeling bad or failure about yourself  0 0 0  Trouble concentrating 0 0 0  Moving slowly or fidgety/restless 0 0 0  Suicidal thoughts 0 0 0   PHQ-9 Score 2 2  1       Data saved with a previous flowsheet row definition        03/21/2024   10:40 AM 11/29/2022   10:28 AM 03/31/2016    8:10 AM 02/05/2016    1:37 PM  GAD 7  : Generalized Anxiety Score  Nervous, Anxious, on Edge 0 1 0 0  Control/stop worrying 0 1 0 0  Worry too much - different things 0 1 0 0  Trouble relaxing 0 0 0 1  Restless 0 0 0 1  Easily annoyed or irritable 1 1 0 1  Afraid - awful might happen 0 0 0 0  Total GAD 7 Score 1 4 0 3    Assessment and Plan:  Pregnancy: [id number] at [redacted]w[redacted]d  1. Subchorionic hematoma in first trimester, single or unspecified fetus (Primary)  Reviewed results. She is having no pain or bleeding Reviewed precautions.   2. History of gestational diabetes  GDM with all pregnancies A1c normal, will get early 2 hour GTT  3. Rubella non-immune status, antepartum  Equivocal result.    Preterm labor symptoms and general obstetric precautions including but not limited to vaginal bleeding, contractions, leaking of fluid and fetal movement were reviewed in detail with the patient. Please refer to After Visit Summary for other counseling recommendations.   Return Needs early 2 hour gtt scheduled please.  Future Appointments  Date Time Provider Department Center  05/15/2024 10:30 AM Dail Meece, Delon FERNS, NP [facility]  05/31/2024  1:00 PM [facility]  PROVIDER 1 [facility]  05/31/2024  1:30 PM [facility] US5 [facility]    Delon Emms, NP

## 2024-04-26 ENCOUNTER — Other Ambulatory Visit

## 2024-04-26 DIAGNOSIS — Z8632 Personal history of gestational diabetes: Secondary | ICD-10-CM | POA: Diagnosis not present

## 2024-04-26 DIAGNOSIS — Z3480 Encounter for supervision of other normal pregnancy, unspecified trimester: Secondary | ICD-10-CM

## 2024-04-26 DIAGNOSIS — Z3A13 13 weeks gestation of pregnancy: Secondary | ICD-10-CM | POA: Diagnosis not present

## 2024-04-27 ENCOUNTER — Ambulatory Visit: Payer: Self-pay | Admitting: Obstetrics and Gynecology

## 2024-04-27 DIAGNOSIS — Z8632 Personal history of gestational diabetes: Secondary | ICD-10-CM

## 2024-04-27 LAB — GLUCOSE TOLERANCE, 2 HOURS W/ 1HR
Glucose, 1 hour: 127 mg/dL (ref 70–179)
Glucose, 2 hour: 101 mg/dL (ref 70–152)
Glucose, Fasting: 74 mg/dL (ref 70–91)

## 2024-05-01 DIAGNOSIS — R6889 Other general symptoms and signs: Secondary | ICD-10-CM | POA: Diagnosis not present

## 2024-05-08 ENCOUNTER — Telehealth: Payer: Self-pay | Admitting: *Deleted

## 2024-05-08 NOTE — Telephone Encounter (Signed)
 Returned call from 05/07/2024 at 1:29 PM. Patient advised of how to transfer records to another office due to insurance change on 05/10/2024.

## 2024-05-15 ENCOUNTER — Encounter: Admitting: Obstetrics and Gynecology

## 2024-05-31 ENCOUNTER — Ambulatory Visit: Payer: Self-pay

## 2024-05-31 ENCOUNTER — Other Ambulatory Visit

## 2024-06-12 ENCOUNTER — Encounter: Admitting: Obstetrics and Gynecology
